# Patient Record
Sex: Female | Born: 1945 | Race: White | Hispanic: No | Marital: Married | Smoking: Former smoker
Health system: Southern US, Community
[De-identification: ages and names within clinical notes are randomized; demographics above are authoritative.]

## PROBLEM LIST (undated history)

## (undated) DIAGNOSIS — R51 Headache: Secondary | ICD-10-CM

## (undated) DIAGNOSIS — E785 Hyperlipidemia, unspecified: Secondary | ICD-10-CM

## (undated) DIAGNOSIS — F32A Depression, unspecified: Secondary | ICD-10-CM

## (undated) DIAGNOSIS — B3781 Candidal esophagitis: Secondary | ICD-10-CM

## (undated) DIAGNOSIS — F329 Major depressive disorder, single episode, unspecified: Secondary | ICD-10-CM

## (undated) DIAGNOSIS — R748 Abnormal levels of other serum enzymes: Secondary | ICD-10-CM

## (undated) DIAGNOSIS — I10 Essential (primary) hypertension: Secondary | ICD-10-CM

## (undated) HISTORY — DX: Headache: R51

## (undated) HISTORY — DX: Depression, unspecified: F32.A

## (undated) HISTORY — DX: Candidal esophagitis: B37.81

## (undated) HISTORY — DX: Abnormal levels of other serum enzymes: R74.8

## (undated) HISTORY — PX: CHOLECYSTECTOMY: SHX55

## (undated) HISTORY — PX: RECTOCELE REPAIR: SHX761

## (undated) HISTORY — DX: Essential (primary) hypertension: I10

## (undated) HISTORY — DX: Major depressive disorder, single episode, unspecified: F32.9

## (undated) HISTORY — DX: Hyperlipidemia, unspecified: E78.5

---

## 2008-12-05 ENCOUNTER — Ambulatory Visit: Payer: Self-pay | Admitting: Nurse Practitioner

## 2011-08-05 ENCOUNTER — Encounter: Payer: Self-pay | Admitting: Internal Medicine

## 2011-08-06 ENCOUNTER — Ambulatory Visit (INDEPENDENT_AMBULATORY_CARE_PROVIDER_SITE_OTHER)
Admission: RE | Admit: 2011-08-06 | Discharge: 2011-08-06 | Disposition: A | Discharge status: Home / Self Care | Payer: 59 | Source: Ambulatory Visit | Attending: Internal Medicine | Admitting: Internal Medicine

## 2011-08-06 ENCOUNTER — Ambulatory Visit (INDEPENDENT_AMBULATORY_CARE_PROVIDER_SITE_OTHER): Payer: 59 | Admitting: Internal Medicine

## 2011-08-06 ENCOUNTER — Encounter: Payer: Self-pay | Admitting: Internal Medicine

## 2011-08-06 VITALS — BP 148/88 | HR 93 | Temp 98.3°F | Ht 62.0 in | Wt 156.6 lb

## 2011-08-06 DIAGNOSIS — R059 Cough, unspecified: Secondary | ICD-10-CM

## 2011-08-06 DIAGNOSIS — R05 Cough: Secondary | ICD-10-CM | POA: Insufficient documentation

## 2011-08-06 DIAGNOSIS — J4530 Mild persistent asthma, uncomplicated: Secondary | ICD-10-CM | POA: Insufficient documentation

## 2011-08-06 DIAGNOSIS — J45909 Unspecified asthma, uncomplicated: Secondary | ICD-10-CM

## 2011-08-06 MED ORDER — TRAMADOL HCL 50 MG PO TABS
ORAL_TABLET | ORAL | Status: AC
Start: 2011-08-06 — End: 2011-08-16

## 2011-08-06 MED ORDER — PREDNISONE (PAK) 10 MG PO TABS: ORAL_TABLET | ORAL | Status: AC

## 2011-08-06 MED ORDER — OMEPRAZOLE 20 MG PO TBEC: DELAYED_RELEASE_TABLET | ORAL | Status: DC

## 2011-08-06 MED ORDER — FAMOTIDINE 20 MG PO TABS: ORAL_TABLET | ORAL | Status: DC

## 2011-08-06 NOTE — Patient Instructions (Addendum)
Stop advair, tessilon - only albuterol (ventolin) puffer when you can't catch your breath  Start Pulmicort twice daily with nebulized albuterol until cough completely gone for a couple of weeks then restart the advair   Please see patient coordinator before you leave today  to schedule sinus ct today if possible  The key to effective treatment for your cough is eliminating the non-stop cycle of cough you're stuck in long enough to let your airway heal completely and then see if there is anything still making you cough once you stop the cough suppression, but this should take no more than 5 days to figuure out  First take mucinex dm 2  every 12 hours and supplement if needed with  tramadol 50 mg up to 2 every 4 hours to suppress the urge to cough. Swallowing water or using ice chips/non mint and menthol containing candies (such as lifesavers or sugarless jolly ranchers) are also effective.  You should rest your voice and avoid activities that you know make you cough.  Once you have eliminated the cough for 3 straight days try reducing the tramadol first,  then the  mucinex dm as tolerated.    Try prilosec 20mg   Take 30-60 min before first meal of the day and Pepcid 20 mg one bedtime until cough is completely gone for at least a week without the need for cough suppression  I think of reflux for chronic cough like I do oxygen for fire (doesn't cause the fire but once you get the oxygen suppressed it usually goes away regardless of the exact cause).  GERD (REFLUX)  is an extremely common cause of respiratory symptoms, many times with no significant heartburn at all.    It can be treated with medication, but also with lifestyle changes including avoidance of late meals, excessive alcohol, smoking cessation, and avoid fatty foods, chocolate, peppermint, colas, red wine, and acidic juices such as orange juice.  NO MINT OR MENTHOL PRODUCTS SO NO COUGH DROPS  USE SUGARLESS CANDY INSTEAD (jolley ranchers  or Stover's)  NO OIL BASED VITAMINS - use powdered substitutes.   Prednisone 10 mg take  4 each am x 2 days,   2 each am x 2 days,  1 each am x2days and stop    Please remember to go to the x-ray department downstairs for your tests - we will call you with the results when they are available.     If you are satisfied with your treatment plan let your doctor know and he/she can either refill your medications or you can return here when your prescription runs out.     If in any way you are not 100% satisfied,  please tell us.  If 100% better, tell your friends!

## 2011-08-06 NOTE — Progress Notes (Signed)
  Subjective:    Patient ID: Caitlin Newman, female    DOB: 12/05/1945   MRN: [id number]  HPI  84 yowf no sign smoking hx  With lots of wheezing as child outgrew at very young age (doesn't remember) then recurrent wheezing coughing worse in spring since around 1980 prev seen by [facility] well controlled until Nov 2012 with refractory cough since then so referred 08/06/2011 to pulmonary clinic by PA Ninfa Linden from [place].  08/06/2011 1st pulmonary eval/ Erion Hermans cc persistent cough x 6 months acute onset with "flu" in November now present daily  - had been on advair maint and ACEI but the latter was stopped early May 2013 no improvement cough which is worse p eat and afternoons and cough for an hour at bedtime with sense of something stuck upper mid chest and coughs to point of choking/vomiting>  Productive of min yellow mucus.  Sinus symptoms have been about avg and doesn't use anything for them.  Prednisone did not help. Once gets to Sleep does ok without nocturnal  or early am exacerbation  of respiratory  c/o's or need for noct saba. Also denies any obvious fluctuation of symptoms with weather or environmental changes or other aggravating or alleviating factors except as outlined above    Review of Systems  Constitutional: Negative for fever and unexpected weight change.  HENT: Positive for congestion, sneezing, postnasal drip and sinus pressure. Negative for ear pain, nosebleeds, sore throat, rhinorrhea, trouble swallowing and dental problem.   Eyes: Negative for redness and itching.  Respiratory: Positive for cough, choking, shortness of breath and wheezing. Negative for chest tightness.   Cardiovascular: Negative for palpitations and leg swelling.  Gastrointestinal: Negative for nausea and vomiting.  Genitourinary: Negative for dysuria.  Musculoskeletal: Negative for joint swelling.  Skin: Negative for rash.  Neurological: Negative for headaches.  Hematological: Does not bruise/bleed  easily.  Psychiatric/Behavioral: Negative for dysphoric mood. The patient is nervous/anxious.        Objective:   Physical Exam amb wf nad Wt 156 08/06/2011 HEENT: nl dentition, turbinates, and orophanx. Nl external ear canals without cough reflex   NECK :  without JVD/Nodes/TM/ nl carotid upstrokes bilaterally   LUNGS: no acc muscle use, clear to A and P bilaterally without cough on insp or exp maneuvers   CV:  RRR  no s3 or murmur or increase in P2, no edema   ABD:  soft and nontender with nl excursion in the supine position. No bruits or organomegaly, bowel sounds nl  MS:  warm without deformities, calf tenderness, cyanosis or clubbing  SKIN: warm and dry without lesions    NEURO:  alert, approp, no deficits    CXR  08/06/2011 : Slight flattening of diaphragm on lateral image may reflect minimal hyperinflation. There is minimal central peribronchial thickening  Sinus CT 08/06/2011 Mild chronic sinusitis with mucosal edema in the maxillary sinus bilaterally.          Assessment & Plan:

## 2011-08-06 NOTE — Assessment & Plan Note (Signed)
The most common causes of chronic cough in immunocompetent adults include the following: upper airway cough syndrome (UACS), previously referred to as postnasal drip syndrome (PNDS), which is caused by variety of rhinosinus conditions; (2) asthma; (3) GERD; (4) chronic bronchitis from cigarette smoking or other inhaled environmental irritants; (5) nonasthmatic eosinophilic bronchitis; and (6) bronchiectasis.   These conditions, singly or in combination, have accounted for up to 94% of the causes of chronic cough in prospective studies.   Other conditions have constituted no >6% of the causes in prospective studies These have included bronchogenic carcinoma, chronic interstitial pneumonia, sarcoidosis, left ventricular failure, ACEI-induced cough, and aspiration from a condition associated with pharyngeal dysfunction.  .Chronic cough is often simultaneously caused by more than one condition. A single cause has been found from 38 to 82% of the time, multiple causes from 18 to 62%. Multiply caused cough has been the result of three diseases up to 42% of the time.     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   These are the same pts (now being labeled as having "irritable larynx syndrome" by some cough centers) who not infrequently have a history of having failed to tolerate ace inhibitors,  dry powder inhalers or biphosphonates or report having atypical reflux symptoms that don't respond to standard doses of PPI , and are easily confused as having aecopd or  asthma flares by even experienced allergists/ pulmonologists.  She was on ACEI which are still probably perpetuating the cough at this point (4 weeks minimum is rec and would avoid these in the future as well), and coughs so hard she vomits so much be refluxing  The standardized cough guidelines recently published in Chest by [place] in 2006  are a multiple step process (up to 12!) , not a single office visit,  and are intended  to address this problem logically,  with an alogrithm dependent on response to empiric treatment at  each progressive step  to determine a specific diagnosis with  minimal addtional testing needed. Therefore if compliance is an issue or can't be accurately verified then it's very unlikely the standard evaluation and treatment will be successful here.    Furthermore, response to therapy (other than acute cough suppression, which should only be used short term with avoidance of narcotic containing cough syrups if possible), can be a gradual process for which the patient may not receive immediate benefit.  Unlike going to an eye doctor where the right rx is almost always the first one and is immediately effective, this is almost never the case in the management of chronic cough syndromes and the patient needs to commit up front to compliance with recommendations and have the patience to wait out a response for up to 6 weeks of therapy directed at the likely underlying problem(s).   For now rec focus on eliminating the cycle of cough> airway trauma>inflammation>irritability> cough  See instructions for specific recommendations which were reviewed directly with the patient who was given a copy with highlighter outlining the key  components.

## 2011-08-06 NOTE — Assessment & Plan Note (Signed)
Since DPI's and especially advair can perpetuate the cough cycle rec she change over to neb pulmicort until better then ok to rechallenge with ace  Note the ddx for DDX of  difficult airways managment all start with A and  include Adherence, Ace Inhibitors, Acid Reflux, Active Sinus Disease, Alpha 1 Antitripsin deficiency, Anxiety masquerading as Airways dz,  ABPA,  allergy(esp in young), Aspiration (esp in elderly), Adverse effects of DPI,  Active smokers, plus two Bs  = Bronchiectasis and Beta blocker use..and one C= CHF   So she has 3 of the "usual suspects" which may be contributing to various extent to her cough and refractory symptoms but there's plenty more to do if not 100% satisfied in terms of tracking down an exact cause and fixing it.

## 2011-08-12 NOTE — Progress Notes (Signed)
Quick Note:  Spoke with pt and notified of results per Dr. Wert. Pt verbalized understanding and denied any questions.  ______ 

## 2011-10-12 ENCOUNTER — Telehealth: Payer: Self-pay | Admitting: Internal Medicine

## 2011-10-12 NOTE — Telephone Encounter (Signed)
Spoke with the pt and she was set to see MW on 10-13-11 but had to cancel appt and is requesting another appt this week. I advised the pt that there are no appts available this week but offered Monday at 9am. Pt accepts this appt. Carron Curie, CMA

## 2011-10-13 ENCOUNTER — Ambulatory Visit: Payer: 59 | Admitting: Internal Medicine

## 2011-10-18 ENCOUNTER — Encounter: Payer: Self-pay | Admitting: Internal Medicine

## 2011-10-18 ENCOUNTER — Ambulatory Visit (INDEPENDENT_AMBULATORY_CARE_PROVIDER_SITE_OTHER): Payer: 59 | Admitting: Internal Medicine

## 2011-10-18 VITALS — BP 134/68 | HR 84 | Temp 98.0°F | Ht 62.0 in | Wt 151.0 lb

## 2011-10-18 DIAGNOSIS — J45909 Unspecified asthma, uncomplicated: Secondary | ICD-10-CM

## 2011-10-18 DIAGNOSIS — R05 Cough: Secondary | ICD-10-CM

## 2011-10-18 MED ORDER — PREDNISONE (PAK) 10 MG PO TABS: ORAL_TABLET | ORAL | Status: AC

## 2011-10-18 MED ORDER — TRAMADOL HCL 50 MG PO TABS: ORAL_TABLET | ORAL | Status: AC

## 2011-10-18 MED ORDER — BUDESONIDE 1 MG/2ML IN SUSP: 1.0000 mg | Freq: Two times a day (BID) | RESPIRATORY_TRACT | Status: DC

## 2011-10-18 MED ORDER — FORMOTEROL FUMARATE 20 MCG/2ML IN NEBU: 20.0000 ug | INHALATION_SOLUTION | Freq: Two times a day (BID) | RESPIRATORY_TRACT | Status: DC

## 2011-10-18 NOTE — Assessment & Plan Note (Signed)
Not clear how much of this is asthma but should do fine with budesonide/ performist for now then return in 4 weeks to regroup

## 2011-10-18 NOTE — Patient Instructions (Addendum)
Stop advair, tessilon - only albuterol (ventolin) puffer when you can't catch your breath  Start Pulmicort twice daily with nebulized performist until return    The key to effective treatment for your cough is eliminating the non-stop cycle of cough you're stuck in long enough to let your airway heal completely and then see if there is anything still making you cough once you stop the cough suppression, but this should take no more than 5 days to figuure out  First take delsym 2 tsp every 12 hours and supplement if needed with  tramadol 50 mg up to 2 every 4 hours to suppress the urge to cough. Swallowing water or using ice chips/non mint and menthol containing candies (such as lifesavers or sugarless jolly ranchers) are also effective.  You should rest your voice and avoid activities that you know make you cough.  Once you have eliminated the cough for 3 straight days try reducing the tramadol first,  then the  mucinex dm as tolerated.    Try prilosec 20mg   Take 30-60 min before first meal of the day and Pepcid 20 mg one bedtime until return  GERD (REFLUX)  is an extremely common cause of respiratory symptoms, many times with no significant heartburn at all.    It can be treated with medication, but also with lifestyle changes including avoidance of late meals, excessive alcohol, smoking cessation, and avoid fatty foods, chocolate, peppermint, colas, red wine, and acidic juices such as orange juice.  NO MINT OR MENTHOL PRODUCTS SO NO COUGH DROPS  USE SUGARLESS CANDY INSTEAD (jolley ranchers or Stover's)  NO OIL BASED VITAMINS - use powdered substitutes.   Prednisone 10 mg take  4 each am x 2 days,   2 each am x 2 days,  1 each am x2days and stop    Please schedule a follow up office visit in 2 weeks, sooner if needed

## 2011-10-18 NOTE — Assessment & Plan Note (Signed)
Most likely this is  Classic Upper airway cough syndrome, so named because it's frequently impossible to sort out how much is  CR/sinusitis with freq throat clearing (which can be related to primary GERD)   vs  causing  secondary (" extra esophageal")  GERD from wide swings in gastric pressure that occur with throat clearing, often  promoting self use of mint and menthol lozenges that reduce the lower esophageal sphincter tone and exacerbate the problem further in a cyclical fashion.   These are the same pts (now being labeled as having "irritable larynx syndrome" by some cough centers) who not infrequently have a history of having failed to tolerate ace inhibitors,  dry powder inhalers or biphosphonates or report having atypical reflux symptoms that don't respond to standard doses of PPI , and are easily confused as having aecopd or asthma flares by even experienced allergists/ pulmonologists.  To sort out what's cough variant asthma triggered by dry powder inhalers and what's not, rec change to pulmicort and bud until cough has resolved then rechallenge with hfa at that point.  In meantime stop adair and continued max gerd rx

## 2011-10-18 NOTE — Progress Notes (Signed)
Subjective:    Patient ID: Caitlin Newman, female    DOB: Jul 30, 1945   MRN: [id number]  HPI  69 yowf no sign smoking hx  With lots of wheezing as child outgrew at very young age (doesn't remember) then recurrent wheezing coughing worse in spring since around 1980 prev seen by [facility] well controlled until Nov 2012 with refractory cough since then so referred 08/06/2011 to pulmonary clinic by PA Ninfa Linden from [place].  08/06/2011 1st pulmonary eval/ Junior Kenedy cc persistent cough x 6 months acute onset with "flu" in November now present daily  - had been on advair maint and ACEI but the latter was stopped early May 2013 no improvement cough which is worse p eat and afternoons and cough for an hour at bedtime with sense of something stuck upper mid chest and coughs to point of choking/vomiting>  Productive of min yellow mucus. rec Stop advair, tessilon - only albuterol (ventolin) puffer when you can't catch your breath Start Pulmicort twice daily with nebulized albuterol until cough completely gone for a couple of weeks then restart the advair  Schedule sinus ct > ok  Try prilosec 20mg   Take 30-60 min before first meal of the day and Pepcid 20 mg one bedtime until cough is completely gone for at least a week without the need for cough suppression GERD diet Prednisone 10 mg take  4 each am x 2 days,   2 each am x 2 days,  1 each am x2days and stop     10/18/2011 f/u ov/Tanayah Squitieri cc got some better but never completely then resumed advair and gradually worsening  Cough again  to the point of choking and vomiting despite rx with  ppi and h2hs- no sob unless coughing  Sinus symptoms have been about avg and doesn't use anything for them.  Prednisone did not help previously.  Once gets to Sleep does ok without nocturnal  or early am exacerbation  of respiratory  c/o's or need for noct saba. Also denies any obvious fluctuation of symptoms with weather or environmental changes or other aggravating or alleviating  factors except as outlined above   ROS  The following are not active complaints unless bolded sore throat, dysphagia, dental problems, itching, sneezing,  nasal congestion or excess/ purulent secretions, ear ache,   fever, chills, sweats, unintended wt loss, pleuritic or exertional cp, hemoptysis,  orthopnea pnd or leg swelling, presyncope, palpitations, heartburn, abdominal pain, anorexia, nausea, vomiting, diarrhea  or change in bowel or urinary habits, change in stools or urine, dysuria,hematuria,  rash, arthralgias, visual complaints, headache, numbness weakness or ataxia or problems with walking or coordination,  change in mood/affect or memory.        Objective:   Physical Exam  amb wf very hoarse and classic upper airway cough pattern  Wt 156 08/06/2011 > 10/18/2011  151  HEENT: nl dentition, turbinates, and orophanx. Nl external ear canals without cough reflex   NECK :  without JVD/Nodes/TM/ nl carotid upstrokes bilaterally   LUNGS: no acc muscle use, mostly pseudowheeze  CV:  RRR  no s3 or murmur or increase in P2, no edema   ABD:  soft and nontender with nl excursion in the supine position. No bruits or organomegaly, bowel sounds nl  MS:  warm without deformities, calf tenderness, cyanosis or clubbing  SKIN: warm and dry without lesions    NEURO:  alert, approp, no deficits    CXR  08/06/2011 : Slight flattening of diaphragm on lateral image may  reflect minimal hyperinflation. There is minimal central peribronchial thickening  Sinus CT 08/06/2011 Mild chronic sinusitis with mucosal edema in the maxillary sinus bilaterally.     Assessment & Plan:

## 2011-10-22 ENCOUNTER — Telehealth: Payer: Self-pay | Admitting: Internal Medicine

## 2011-10-22 NOTE — Telephone Encounter (Signed)
Called work number- unable to reach [employer] number and NA, no option to leave a msg, WCB.

## 2011-10-25 NOTE — Telephone Encounter (Signed)
Pt calling again in ref to previous msg can be reached at [phone].Caitlin Newman

## 2011-10-25 NOTE — Telephone Encounter (Addendum)
Pt called back again. Please call before 3:30 today. Pt needs meds asap and doesn't understand why this is taking so long. If calling afterwards call cell # [phone]. Caitlin Newman

## 2011-10-26 NOTE — Telephone Encounter (Signed)
Called [facility] at [phone], spoke with [place].  Pulmicort 1mg / 2mL neb med bid has been APPROVED October 05, 2011 - Oct 25, 2012.  Case ID # [id number].    Called [facility], spoke with Vernona Rieger. She is aware of approval.    Called, spoke with pt.  I apologized in the delay of this matter and informed pulmicort has been approved.  She verbalized understanding and voiced no further questions/concerns at this time.

## 2011-11-08 ENCOUNTER — Encounter: Payer: Self-pay | Admitting: Internal Medicine

## 2011-11-08 ENCOUNTER — Ambulatory Visit (INDEPENDENT_AMBULATORY_CARE_PROVIDER_SITE_OTHER): Payer: 59 | Admitting: Internal Medicine

## 2011-11-08 VITALS — BP 148/80 | HR 72 | Temp 98.3°F | Ht 62.0 in | Wt 156.6 lb

## 2011-11-08 DIAGNOSIS — J45909 Unspecified asthma, uncomplicated: Secondary | ICD-10-CM

## 2011-11-08 MED ORDER — AMOXICILLIN-POT CLAVULANATE 875-125 MG PO TABS: 1.0000 | ORAL_TABLET | Freq: Two times a day (BID) | ORAL | Status: AC

## 2011-11-08 MED ORDER — PREDNISONE (PAK) 10 MG PO TABS: ORAL_TABLET | ORAL | Status: AC

## 2011-11-08 NOTE — Progress Notes (Signed)
Subjective:    Patient ID: Caitlin Newman, female    DOB: November 20, 1945   MRN: [id number]  HPI  34 yowf no sign smoking hx  With lots of wheezing as child outgrew at very young age (doesn't remember) then recurrent wheezing coughing worse in spring since around 1980 prev seen by [facility] well controlled until Nov 2012 with refractory cough since then so referred 08/06/2011 to pulmonary clinic by PA Ninfa Linden from [place].  08/06/2011 1st pulmonary eval/ Wert cc persistent cough x 6 months acute onset with "flu" in November now present daily  - had been on advair maint and ACEI but the latter was stopped early May 2013 no improvement cough which is worse p eat and afternoons and cough for an hour at bedtime with sense of something stuck upper mid chest and coughs to point of choking/vomiting>  Productive of min yellow mucus. rec Stop advair, tessilon - only albuterol (ventolin) puffer when you can't catch your breath Start Pulmicort twice daily with nebulized albuterol until cough completely gone for a couple of weeks then restart the advair  Schedule sinus ct > Mild chronic sinusitis with mucosal edema in the maxillary sinus  bilaterally. Try prilosec 20mg   Take 30-60 min before first meal of the day and Pepcid 20 mg one bedtime until cough is completely gone for at least a week without the need for cough suppression GERD diet Prednisone 10 mg take  4 each am x 2 days,   2 each am x 2 days,  1 each am x2days and stop     10/18/2011 f/u ov/Wert cc got some better but never completely then resumed advair and gradually worsening  Cough again  to the point of choking and vomiting despite rx with  ppi and h2hs- no sob unless coughing Sinus symptoms have been about avg and doesn't use anything for them.  Prednisone did not help previously. rec Stop advair, tessilon - only albuterol (ventolin) puffer when you can't catch your breath Start Pulmicort twice daily with nebulized performist until  return Cyclical cough rx  11/08/2011 f/u ov/Wert cc 50% better overall on bud/perfomist x 2 weeks, not needing any other breathing treatments.  Immediately on lying down  feels need to cough mucus up quite thick slt green, min vol, on delsym. By 10 am cough has resumed, once coughs mucus up good to go for the rest of the day but takes up to an hour   Once gets to Sleep does ok without nocturnal  or early am exacerbation  of respiratory  c/o's or need for noct saba. Also denies any obvious fluctuation of symptoms with weather or environmental changes or other aggravating or alleviating factors except as outlined above   ROS  The following are not active complaints unless bolded sore throat, dysphagia, dental problems, itching, sneezing,  nasal congestion or excess/ purulent secretions, ear ache,   fever, chills, sweats, unintended wt loss, pleuritic or exertional cp, hemoptysis,  orthopnea pnd or leg swelling, presyncope, palpitations, heartburn, abdominal pain, anorexia, nausea, vomiting, diarrhea  or change in bowel or urinary habits, change in stools or urine, dysuria,hematuria,  rash, arthralgias, visual complaints, headache, numbness weakness or ataxia or problems with walking or coordination,  change in mood/affect or memory.        Objective:   Physical Exam  amb wf moderately  hoarse and classic upper airway cough pattern  Wt 156 08/06/2011 > 10/18/2011  151 >11/08/2011  156  HEENT: nl dentition, turbinates, and orophanx.  Nl external ear canals without cough reflex   NECK :  without JVD/Nodes/TM/ nl carotid upstrokes bilaterally   LUNGS: no acc muscle use, mostly pseudowheeze bilaterally  CV:  RRR  no s3 or murmur or increase in P2, no edema   ABD:  soft and nontender with nl excursion in the supine position. No bruits or organomegaly, bowel sounds nl  MS:  warm without deformities, calf tenderness, cyanosis or clubbing      CXR  08/06/2011 : Slight flattening of diaphragm on  lateral image may reflect minimal hyperinflation. There is minimal central peribronchial thickening  Sinus CT 08/06/2011 Mild chronic sinusitis with mucosal edema in the maxillary sinus bilaterally.     Assessment & Plan:

## 2011-11-08 NOTE — Assessment & Plan Note (Signed)
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

## 2011-11-08 NOTE — Patient Instructions (Addendum)
Mucinex 600 mg 2 every 12 hours for congestion / rattling  Augmentin twice daily with bafast and supper and yogurt for lunch   Prednisone 10 mg take  4 each am x 2 days,   2 each am x 2 days,  1 each am x2days and stop   Use plenty of nasal saline   Please schedule a follow up office visit in 3 weeks, sooner if needed   Late add: consider chest ct, alpha one and allergy studies next ov

## 2011-11-11 ENCOUNTER — Telehealth: Payer: Self-pay | Admitting: Internal Medicine

## 2011-11-11 NOTE — Telephone Encounter (Signed)
Best option is dulera 100 2bid when runs out and recheck on return

## 2011-11-11 NOTE — Telephone Encounter (Signed)
PT reports that she is feeling better since on abx and prednisone.  Pt will run out of Performist and Pulmicort on 11-25-11 and doesn't want to have these refilled due to cost.  PT doesn't see MW again until 12-14-11.  Please advise if pt can change  med to replace neb meds.

## 2011-11-11 NOTE — Telephone Encounter (Signed)
Spoke with pt and notified of recs per MW. Samples of Dulera 100 up front for pick up.

## 2011-11-22 ENCOUNTER — Telehealth: Payer: Self-pay | Admitting: Internal Medicine

## 2011-11-22 ENCOUNTER — Encounter: Payer: Self-pay | Admitting: *Deleted

## 2011-11-22 NOTE — Telephone Encounter (Signed)
Pt states she finished the Augmentin 2 days ago but still has cough and congestion. She says her symptoms are better but not gone. I have instructed the pt to continue on the Mucinex twice a day and to give it a few more days because the abx is still working in her system. She verbalized understanding and and will give it a few more days and call if her symptoms get worse.

## 2011-12-07 ENCOUNTER — Encounter: Payer: Self-pay | Admitting: Internal Medicine

## 2011-12-07 ENCOUNTER — Ambulatory Visit (INDEPENDENT_AMBULATORY_CARE_PROVIDER_SITE_OTHER): Payer: 59 | Admitting: Internal Medicine

## 2011-12-07 ENCOUNTER — Other Ambulatory Visit (INDEPENDENT_AMBULATORY_CARE_PROVIDER_SITE_OTHER): Payer: 59

## 2011-12-07 ENCOUNTER — Telehealth: Payer: Self-pay | Admitting: Internal Medicine

## 2011-12-07 ENCOUNTER — Ambulatory Visit: Payer: 59 | Admitting: Adult Health

## 2011-12-07 VITALS — BP 152/82 | HR 73 | Temp 97.5°F | Ht 62.0 in | Wt 156.8 lb

## 2011-12-07 DIAGNOSIS — J45909 Unspecified asthma, uncomplicated: Secondary | ICD-10-CM

## 2011-12-07 DIAGNOSIS — R05 Cough: Secondary | ICD-10-CM

## 2011-12-07 LAB — CBC WITH DIFFERENTIAL/PLATELET
Basophils Absolute: 0 10*3/uL (ref 0.0–0.1)
Eosinophils Relative: 3.5 % (ref 0.0–5.0)
Lymphocytes Relative: 41.2 % (ref 12.0–46.0)
Lymphs Abs: 3 10*3/uL (ref 0.7–4.0)
Monocytes Relative: 7.4 % (ref 3.0–12.0)
Neutrophils Relative %: 47.2 % (ref 43.0–77.0)
Platelets: 291 10*3/uL (ref 150.0–400.0)
RDW: 13.6 % (ref 11.5–14.6)
WBC: 7.3 10*3/uL (ref 4.5–10.5)

## 2011-12-07 MED ORDER — AMOXICILLIN-POT CLAVULANATE 875-125 MG PO TABS: 1.0000 | ORAL_TABLET | Freq: Two times a day (BID) | ORAL | Status: DC

## 2011-12-07 MED ORDER — TRAMADOL HCL 50 MG PO TABS: ORAL_TABLET | ORAL | Status: DC

## 2011-12-07 MED ORDER — PREDNISONE (PAK) 10 MG PO TABS: ORAL_TABLET | ORAL | Status: DC

## 2011-12-07 NOTE — Telephone Encounter (Signed)
Was due back by now for f/u - needs ov with all meds in hand to see Tammy Np and consider ct chest (for bronchiectasis) and repeat sinus ct   Nothing further to offer over the phone Can add on to see me this pm also but not 9/25

## 2011-12-07 NOTE — Patient Instructions (Addendum)
Take mucinex 2  every 12 hours and supplement if needed with  tramadol 50 mg up to 1 every 4 hours to suppress the urge to cough. Swallowing water or using ice chips/non mint and menthol containing candies (such as lifesavers or sugarless jolly ranchers) are also effective.  You should rest your voice and avoid activities that you know make you cough.  Once you have eliminated the cough for 3 straight days try reducing the tramadol first,  then the delsym as tolerated.    Augmentin twice daily with bafast and supper and yogurt for lunch  X 10 days  Prednisone 10 mg take  4 each am x 2 days,   2 each am x 2 days,  1 each am x2days and stop   Use plenty of nasal saline   Work on inhaler technique:  relax and gently blow all the way out then take a nice smooth deep breath back in, triggering the inhaler at same time you start breathing in.  Hold for up to 5 seconds if you can.  Rinse and gargle with water when done   If your mouth or throat starts to bother you,   I suggest you time the inhaler to your dental care and after using the inhaler(s) brush teeth and tongue with a baking soda containing toothpaste and when you rinse this out, gargle with it first to see if this helps your mouth and throat.     Please remember to go to the lab  department downstairs for your tests - we will call you with the results when they are available.  Please schedule a follow up office visit in 4 weeks, sooner if needed (ok to come to the [place] the 3rd week in October)

## 2011-12-07 NOTE — Progress Notes (Signed)
Subjective:    Patient ID: Caitlin Newman, female    DOB: 1945-08-27   MRN: [id number]  HPI  89 yowf no sign smoking hx  With lots of wheezing as child outgrew at very young age (doesn't remember) then recurrent wheezing coughing worse in spring since around 1980 prev seen by [facility] well controlled until Nov 2012 with refractory cough since then so referred 08/06/2011 to pulmonary clinic by PA Ninfa Linden from [place].  08/06/2011 1st pulmonary eval/ Caitlin Newman cc persistent cough x 6 months acute onset with "flu" in November now present daily  - had been on advair maint and ACEI but the latter was stopped early May 2013 no improvement cough which is worse p eat and afternoons and cough for an hour at bedtime with sense of something stuck upper mid chest and coughs to point of choking/vomiting>  Productive of min yellow mucus. rec Stop advair, tessilon - only albuterol (ventolin) puffer when you can't catch your breath Start Pulmicort twice daily with nebulized albuterol until cough completely gone for a couple of weeks then restart the advair  Schedule sinus ct > Mild chronic sinusitis with mucosal edema in the maxillary sinus  bilaterally. Try prilosec 20mg   Take 30-60 min before first meal of the day and Pepcid 20 mg one bedtime until cough is completely gone for at least a week without the need for cough suppression GERD diet Prednisone 10 mg take  4 each am x 2 days,   2 each am x 2 days,  1 each am x2days and stop     10/18/2011 f/u ov/Caitlin Newman cc got some better but never completely then resumed advair and gradually worsening  Cough again  to the point of choking and vomiting despite rx with  ppi and h2hs- no sob unless coughing Sinus symptoms have been about avg and doesn't use anything for them.  Prednisone did not help previously. rec Stop advair, tessilon - only albuterol (ventolin) puffer when you can't catch your breath Start Pulmicort twice daily with nebulized performist until  return Cyclical cough rx  11/08/2011 f/u ov/Caitlin Newman cc 50% better overall on bud/perfomist x 2 weeks, not needing any other breathing treatments.  Immediately on lying down  feels need to cough mucus up quite thick slt green, min vol, on delsym. By 10 am cough has resumed, once coughs mucus up good to go for the rest of the day but takes up to an hour rec Mucinex 600 mg 2 every 12 hours for congestion / rattling Augmentin twice daily with bafast and supper and yogurt for lunch  Prednisone 10 mg take  4 each am x 2 days,   2 each am x 2 days,  1 each am x2days and stop  Use plenty of nasal saline  Please schedule a follow up office visit in 3 weeks, sooner if needed   Late add: consider chest ct, alpha one and allergy studies next ov    12/07/2011 f/u ov/Caitlin Newman cc pt states prod cough with brown mucus "chunks" has worsened x 2 weeks p changed performist/bud to dulera (poor mdi noted, see below). pt also c/o wheezing.  Staying on prilosec and pepcid thruout.  Coughing so hard loosing her breath.  No obvious daytime variabilty or  p or chest tightness, subjective wheeze overt sinus or hb symptoms. No unusual exp hx    Once gets to Sleep does ok without nocturnal  or early am exacerbation  of respiratory  c/o's or need for noct saba. Also  denies any obvious fluctuation of symptoms with weather or environmental changes or other aggravating or alleviating factors except as outlined above   ROS  The following are not active complaints unless bolded sore throat, dysphagia, dental problems, itching, sneezing,  nasal congestion or excess/ purulent secretions, ear ache,   fever, chills, sweats, unintended wt loss, pleuritic or exertional cp, hemoptysis,  orthopnea pnd or leg swelling, presyncope, palpitations, heartburn, abdominal pain, anorexia, nausea, vomiting, diarrhea  or change in bowel or urinary habits, change in stools or urine, dysuria,hematuria,  rash, arthralgias, visual complaints, headache, numbness  weakness or ataxia or problems with walking or coordination,  change in mood/affect or memory.        Objective:   Physical Exam  amb wf moderately  hoarse and harsh upper airway cough pattern  Wt 156 08/06/2011 > 10/18/2011  151 >11/08/2011  156 > 156 12/07/2011   HEENT: nl dentition, turbinates, and orophanx. Nl external ear canals without cough reflex   NECK :  without JVD/Nodes/TM/ nl carotid upstrokes bilaterally   LUNGS: no acc muscle use, mostly pseudowheeze bilaterally  CV:  RRR  no s3 or murmur or increase in P2, no edema   ABD:  soft and nontender with nl excursion in the supine position. No bruits or organomegaly, bowel sounds nl  MS:  warm without deformities, calf tenderness, cyanosis or clubbing      CXR  08/06/2011 : Slight flattening of diaphragm on lateral image may reflect minimal hyperinflation. There is minimal central peribronchial thickening  Sinus CT 08/06/2011 Mild chronic sinusitis with mucosal edema in the maxillary sinus bilaterally.     Assessment & Plan:

## 2011-12-07 NOTE — Telephone Encounter (Signed)
See previous note

## 2011-12-07 NOTE — Telephone Encounter (Signed)
Called and spoke with patient, patient states she is still coughing and is currently coughing up brown pieces of "junk".  Patient states she called in last week req another round of antibiotic sent in but was told to let the previous antibiotic try and keep working.  Patient is taking mucinex for symtpoms.  Last antibiotic called in was Augmentin BID.  Dr. Sherene Sires please advise, thank you!  Last OV :11/08/11 Next OV:12/15/11  Using [facility] for pharmacy  Allergies  Allergen Reactions  . Avelox (Moxifloxacin Hcl In Nacl)   . Sulfa Antibiotics

## 2011-12-07 NOTE — Assessment & Plan Note (Signed)
-   D/c advair 10/18/2011 due to cough   - HFA 12/07/2011 50% p coaching  DDX of  difficult airways managment all start with A and  include Adherence, Ace Inhibitors, Acid Reflux, Active Sinus Disease, Alpha 1 Antitripsin deficiency, Anxiety masquerading as Airways dz,  ABPA,  allergy(esp in young), Aspiration (esp in elderly), Adverse effects of DPI,  Active smokers, plus two Bs  = Bronchiectasis and Beta blocker use..and one C= CHF  ? Adh> Adherence is always the initial "prime suspect" and is a multilayered concern that requires a "trust but verify" approach in every patient - starting with knowing how to use medications, especially inhalers, correctly, keeping up with refills and understanding the fundamental difference between maintenance and prns vs those medications only taken for a very short course and then stopped and not refilled. The proper method of use, as well as anticipated side effects, of a metered-dose inhaler are discussed and demonstrated to the patient. Improved effectiveness after extensive coaching during this visit to a level of approximately  50% so needs more work  ? Acid reflux > reviewed rx  ? Allergies > send profile 12/07/2011

## 2011-12-07 NOTE — Telephone Encounter (Signed)
I spoke with the pt and advised of recs. Pt states she is 1 hour away and cannot be her until 4pm. Appt set, MW is aware. Carron Curie, CMA

## 2011-12-07 NOTE — Assessment & Plan Note (Signed)
Much worse and out of proportion to asthma  Chronic cough is often simultaneously caused by more than one condition. A single cause has been found from 38 to 82% of the time, multiple causes from 18 to 62%. Multiply caused cough has been the result of three diseases up to 42% of the time.   For now will use the mildest asthma rx feasible in form of dulera 100 with low threshold to step down to qvar if improves  Of the three most common causes of chronic cough, only one (GERD)  can actually cause the other two (asthma and post nasal drip syndrome)  and perpetuate the cylce of cough inducing airway trauma, inflammation, heightened sensitivity to reflux which is prompted by the cough itself via a cyclical mechanism.    This may partially respond to steroids and look like asthma and post nasal drainage but never erradicated completely unless the cough and the secondary reflux are eliminated, preferably both at the same time.  While not intuitively obvious, many patients with chronic low grade reflux do not cough until there is a secondary insult that disturbs the protective epithelial barrier and exposes sensitive nerve endings.  This can be viral or direct physical injury such as with an endotracheal tube.   The point is that once this occurs, it is difficult to eliminate using anything but a maximally effective acid suppression regimen at least in the short run, accompanied by an appropriate diet to address non acid GERD.   See instructions for specific recommendations which were reviewed directly with the patient who was given a copy with highlighter outlining the key components.

## 2011-12-08 LAB — ALLERGY PROFILE REGION II-DC, DE, MD, ~~LOC~~, VA
Alternaria Alternata: <0.1 kU/L
Cat Dander: <0.1 kU/L
Cockroach: <0.1 kU/L
Common Ragweed: <0.1 kU/L
Dog Dander: <0.1 kU/L
Elm IgE: <0.1 kU/L
IgE (Immunoglobulin E), Serum: 89.4 IU/mL (ref 0.0–180.0)
Johnson Grass: <0.1 kU/L
Lamb's Quarters: <0.1 kU/L
Meadow Grass: <0.1 kU/L
Pecan/Hickory Tree IgE: <0.1 kU/L

## 2011-12-10 ENCOUNTER — Encounter: Payer: Self-pay | Admitting: Internal Medicine

## 2011-12-10 NOTE — Progress Notes (Signed)
Quick Note:  Spoke with pt and notified of results per Dr. Wert. Pt verbalized understanding and denied any questions.  ______ 

## 2011-12-14 ENCOUNTER — Ambulatory Visit: Payer: 59 | Admitting: Internal Medicine

## 2011-12-27 ENCOUNTER — Ambulatory Visit (INDEPENDENT_AMBULATORY_CARE_PROVIDER_SITE_OTHER): Payer: 59 | Admitting: Internal Medicine

## 2011-12-27 ENCOUNTER — Encounter: Payer: Self-pay | Admitting: Internal Medicine

## 2011-12-27 VITALS — BP 112/60 | HR 82 | Temp 98.0°F | Ht 62.0 in | Wt 155.0 lb

## 2011-12-27 DIAGNOSIS — J479 Bronchiectasis, uncomplicated: Secondary | ICD-10-CM

## 2011-12-27 DIAGNOSIS — R05 Cough: Secondary | ICD-10-CM

## 2011-12-27 DIAGNOSIS — J45909 Unspecified asthma, uncomplicated: Secondary | ICD-10-CM

## 2011-12-27 MED ORDER — MONTELUKAST SODIUM 10 MG PO TABS: 10.0000 mg | ORAL_TABLET | Freq: Every day | ORAL | Status: DC

## 2011-12-27 NOTE — Patient Instructions (Addendum)
Dulera 100 Take 2 puffs first thing in am and then another 2 puffs about 12 hours later but make sure you look to be sure the count goes down by 2 each treatment.  Prilosec omeprazole 20 mg Take 30- 60 min before your first and last meals of the day and pepcid 20 mg one at bedtime    Singulair 10 mg one each pm  As needed for drainage take chortrimeton 4 mg every 6 hours.  Please schedule a follow up office visit in 2  weeks, sooner if needed CT chest  And flutter valve training on return to  See Tammy NP w/in 2 weeks with all your medications, even over the counter meds, separated in two separate bags, the ones you take no matter what vs the ones you stop once you feel better and take only as needed when you feel you need them.   Tammy  will generate for you a new user friendly medication calendar that will put [place] all on the same page re: your medication use.     Without this process, it simply isn't possible to assure that we are providing  your outpatient care  with  the attention to detail we feel you deserve.   If we cannot assure that you're getting that kind of care,  then we cannot manage your problem effectively from this clinic.  Once you have seen Tammy and we are sure that we're all on the same page with your medication use she will arrange follow up with me.   Late add Needs alpha one AT genotype also flutter valve training

## 2011-12-27 NOTE — Progress Notes (Signed)
Subjective:    Patient ID: Caitlin Newman, female    DOB: Sep 26, 1945   MRN: [id number]  HPI  48 yowf no sign smoking hx  With lots of wheezing as child outgrew at very young age (doesn't remember) then recurrent wheezing coughing worse in spring since around 1980 prev seen by [facility] well controlled until Nov 2012 with refractory cough since then so referred 08/06/2011 to pulmonary clinic by PA Ninfa Linden from [place].  08/06/2011 1st pulmonary eval/ Caitlin Newman cc persistent cough x 6 months acute onset with "flu" in November now present daily  - had been on advair maint and ACEI but the latter was stopped early May 2013 no improvement cough which is worse p eat and afternoons and cough for an hour at bedtime with sense of something stuck upper mid chest and coughs to point of choking/vomiting>  Productive of min yellow mucus. rec Stop advair, tessilon - only use albuterol (ventolin) puffer as rescue Start Pulmicort twice daily with nebulized albuterol until cough completely gone for a couple of weeks then restart the advair  Schedule sinus ct > Mild chronic sinusitis with mucosal edema in the maxillary sinus  bilaterally. Try prilosec 20mg   Take 30-60 min before first meal of the day and Pepcid 20 mg one bedtime until cough is completely gone for at least a week without the need for cough suppression GERD diet Prednisone 10 mg take  4 each am x 2 days,   2 each am x 2 days,  1 each am x2days and stop     10/18/2011 f/u ov/Caitlin Newman cc got some better but never completely then resumed advair and gradually worsening  Cough again  to the point of choking and vomiting despite rx with  ppi and h2hs- no sob unless coughing Sinus symptoms have been about avg and doesn't use anything for them.  Prednisone did not help previously. rec Stop advair, tessilon - only use albuterol (ventolin) puffer as rescue Start Pulmicort twice daily with nebulized performist until return Cyclical cough rx  11/08/2011 f/u  ov/Caitlin Newman cc 50% better overall on bud/perfomist x 2 weeks, not needing any other breathing treatments.  Immediately on lying down  feels need to cough mucus up quite thick slt green, min vol, on delsym. By 10 am cough has resumed, once coughs mucus up good to go for the rest of the day but takes up to an hour rec Mucinex 600 mg 2 every 12 hours for congestion / rattling Augmentin twice daily with bafast and supper and yogurt for lunch  Prednisone 10 mg take  4 each am x 2 days,   2 each am x 2 days,  1 each am x2days and stop  Use plenty of nasal saline       12/07/2011 f/u ov/Caitlin Newman cc pt states prod cough with brown mucus "chunks" has worsened x 2 weeks p changed performist/bud to dulera (poor mdi noted, see below). pt also c/o wheezing.  Staying on prilosec and pepcid thruout.  Coughing so hard loosing her breath.   rec Take mucinex 2  every 12 hours and supplement if needed with  tramadol 50 mg up to 1 every 4 hours to suppress the urge to cough. Once you have eliminated the cough for 3 straight days try reducing the tramadol first,  then the delsym as tolerated.   Augmentin twice daily with bafast and supper and yogurt for lunch  X 10 days Prednisone 10 mg take  4 each am x 2 days,  2 each am x 2 days,  1 each am x2days and stop  Use plenty of nasal saline  Work on inhaler technique:        12/27/2011 f/u ov/Caitlin Newman cc only went two straight days with no cough then resumed mostly daytime cough with sensation of pnds but no purulent sputum.  Brought old instructions, not using the new ones    Once gets to Sleep does ok without nocturnal  or early am exacerbation  of respiratory  c/o's or need for noct saba. Also denies any obvious fluctuation of symptoms with weather or environmental changes or other aggravating or alleviating factors except as outlined above   ROS  The following are not active complaints unless bolded sore throat, dysphagia, dental problems, itching, sneezing,  nasal  congestion or excess/ purulent secretions, ear ache,   fever, chills, sweats, unintended wt loss, pleuritic or exertional cp, hemoptysis,  orthopnea pnd or leg swelling, presyncope, palpitations, heartburn, abdominal pain, anorexia, nausea, vomiting, diarrhea  or change in bowel or urinary habits, change in stools or urine, dysuria,hematuria,  rash, arthralgias, visual complaints, headache, numbness weakness or ataxia or problems with walking or coordination,  change in mood/affect or memory.        Objective:   Physical Exam  amb wf moderately  hoarse and harsh honking quality upper airway cough pattern  Wt 156 08/06/2011 > 10/18/2011  151 >11/08/2011  156 > 156 12/07/2011 > 12/27/2011  155  HEENT: nl dentition, turbinates, and orophanx. Nl external ear canals without cough reflex   NECK :  without JVD/Nodes/TM/ nl carotid upstrokes bilaterally   LUNGS: no acc muscle use, mostly pseudowheeze bilaterally, minimal true rhonchi  CV:  RRR  no s3 or murmur or increase in P2, no edema   ABD:  soft and nontender with nl excursion in the supine position. No bruits or organomegaly, bowel sounds nl  MS:  warm without deformities, calf tenderness, cyanosis or clubbing      CXR  08/06/2011 : Slight flattening of diaphragm on lateral image may reflect minimal hyperinflation. There is minimal central peribronchial thickening  Sinus CT 08/06/2011 Mild chronic sinusitis with mucosal edema in the maxillary sinus bilaterally.     Assessment & Plan:

## 2011-12-28 NOTE — Assessment & Plan Note (Signed)
Most likely this is  Classic Upper airway cough syndrome, so named because it's frequently impossible to sort out how much is  CR/sinusitis with freq throat clearing (which can be related to primary GERD)   vs  causing  secondary (" extra esophageal")  GERD from wide swings in gastric pressure that occur with throat clearing, often  promoting self use of mint and menthol lozenges that reduce the lower esophageal sphincter tone and exacerbate the problem further in a cyclical fashion.   These are the same pts (now being labeled as having "irritable larynx syndrome" by some cough centers) who not infrequently have a history of having failed to tolerate ace inhibitors,  dry powder inhalers or biphosphonates or report having atypical reflux symptoms that don't respond to standard doses of PPI , and are easily confused as having aecopd or asthma flares by even experienced allergists/ pulmonologists.  For now needs max gerd rx, eliminate pnds with 1st gen H1 per guidelines.

## 2011-12-28 NOTE — Assessment & Plan Note (Signed)
-   D/c advair 10/18/2011 due to cough   - HFA 12/07/2011 50%  > 50% 12/27/11 p extensive coaching   - Allergy Profile 12/07/11 >  IgE 89 but no specific allergen identified   - CT chest/alpha one AT rec 12/28/2011 >>>  Symptoms are markedly disproportionate to objective findings and not clear this is a lung problem but pt does appear to have difficult airway management issues.  DDX of  difficult airways managment all start with A and  include Adherence, Ace Inhibitors, Acid Reflux, Active Sinus Disease, Alpha 1 Antitripsin deficiency, Anxiety masquerading as Airways dz,  ABPA,  allergy(esp in young), Aspiration (esp in elderly), Adverse effects of DPI,  Active smokers, plus two Bs  = Bronchiectasis and Beta blocker use..and one C= CHF  Adherence is always the initial "prime suspect" and is a multilayered concern that requires a "trust but verify" approach in every patient - starting with knowing how to use medications, especially inhalers, correctly, keeping up with refills and understanding the fundamental difference between maintenance and prns vs those medications only taken for a very short course and then stopped and not refilled. She is not precise enough with meds/ instruction to solve her problems at this point.   To keep things simple, I have asked the patient to first separate medicines that are perceived as maintenance, that is to be taken daily "no matter what", from those medicines that are taken on only on an as-needed basis and I have given the patient examples of both, and then return to see our NP to generate a  detailed  medication calendar which should be followed until the next physician sees the patient and updates it.    The proper method of use, as well as anticipated side effects, of a metered-dose inhaler are discussed and demonstrated to the patient. Improved effectiveness after extensive coaching during this visit to a level of approximately  50% but no better - she was clearly  improving on neb pulmicort/formoterol but refused to resume due to insurance issues/ cost.  ? Alpha one AT > needs genotype  ? Allergy > note IgE elevate but no specific allergen and no response to prednisone > try singulair   ? Bronchiectasis > needs chest ct next

## 2011-12-29 ENCOUNTER — Ambulatory Visit: Payer: 59 | Admitting: Internal Medicine

## 2012-01-10 ENCOUNTER — Ambulatory Visit (INDEPENDENT_AMBULATORY_CARE_PROVIDER_SITE_OTHER)
Admission: RE | Admit: 2012-01-10 | Discharge: 2012-01-10 | Disposition: A | Discharge status: Home / Self Care | Payer: 59 | Source: Ambulatory Visit | Attending: Internal Medicine | Admitting: Internal Medicine

## 2012-01-10 ENCOUNTER — Ambulatory Visit (INDEPENDENT_AMBULATORY_CARE_PROVIDER_SITE_OTHER): Payer: 59 | Admitting: Adult Health

## 2012-01-10 ENCOUNTER — Encounter: Payer: Self-pay | Admitting: Adult Health

## 2012-01-10 VITALS — BP 132/84 | HR 74 | Temp 98.1°F | Ht 62.0 in | Wt 156.8 lb

## 2012-01-10 DIAGNOSIS — J45909 Unspecified asthma, uncomplicated: Secondary | ICD-10-CM

## 2012-01-10 DIAGNOSIS — J479 Bronchiectasis, uncomplicated: Secondary | ICD-10-CM

## 2012-01-10 NOTE — Patient Instructions (Addendum)
Add Delsym 2 tsp Twice daily  For cough  Add Tramadol 50mg  1-2 every 4hr as needed for breakthrough cough  Follow med calendar closely and bring to each visit.  Goal is NO COUGHING -NO THROAT CLEARING  Use sips of water, and sugarless candy. No Mints to avoid coughing and throat clearing.  follow up Dr. Sherene Sires  In 4 weeks and As needed   Please contact office for sooner follow up if symptoms do not improve or worsen or seek emergency care

## 2012-01-11 ENCOUNTER — Encounter: Payer: Self-pay | Admitting: Internal Medicine

## 2012-01-11 DIAGNOSIS — J479 Bronchiectasis, uncomplicated: Secondary | ICD-10-CM | POA: Insufficient documentation

## 2012-01-11 MED ORDER — FLUTTER DEVI: Status: AC

## 2012-01-12 ENCOUNTER — Telehealth: Payer: Self-pay | Admitting: Internal Medicine

## 2012-01-12 NOTE — Assessment & Plan Note (Signed)
Cont on current regimen , add delsym and tramadol for cough  Patient's medications were reviewed today and patient education was given. Computerized medication calendar was adjusted/completed   Plan  Delsym 2 tsp Twice daily  For cough  Add Tramadol 50mg  1-2 every 4hr as needed for breakthrough cough  Follow med calendar closely and bring to each visit.  Goal is NO COUGHING -NO THROAT CLEARING  Use sips of water, and sugarless candy. No Mints to avoid coughing and throat clearing.  follow up Dr. Sherene Sires  In 4 weeks and As needed   Please contact office for sooner follow up if symptoms do not improve or worsen or seek emergency care

## 2012-01-12 NOTE — Progress Notes (Signed)
Quick Note:  Called and spoke with patient informed her Dr, Sherene Sires recs/results as listed below. Patient verbalized understanding. Nothing further needed from patient at this time. And patient already has appt scheduled for 02/18/12. ______

## 2012-01-12 NOTE — Progress Notes (Signed)
Subjective:    Patient ID: Caitlin Newman, female    DOB: [birth year]   MRN: [id number]  HPI 57 yowf no sign smoking hx  With lots of wheezing as child outgrew at very young age (doesn't remember) then recurrent wheezing coughing worse in spring since around 1980 prev seen by [facility] well controlled until Nov 2012 with refractory cough since then so referred 08/06/2011 to pulmonary clinic by PA Ninfa Linden from [place].  08/06/2011 1st pulmonary eval/ Wert cc persistent cough x 6 months acute onset with "flu" in November now present daily  - had been on advair maint and ACEI but the latter was stopped early May 2013 no improvement cough which is worse p eat and afternoons and cough for an hour at bedtime with sense of something stuck upper mid chest and coughs to point of choking/vomiting>  Productive of min yellow mucus. rec Stop advair, tessilon - only use albuterol (ventolin) puffer as rescue Start Pulmicort twice daily with nebulized albuterol until cough completely gone for a couple of weeks then restart the advair  Schedule sinus ct > Mild chronic sinusitis with mucosal edema in the maxillary sinus  bilaterally. Try prilosec 20mg   Take 30-60 min before first meal of the day and Pepcid 20 mg one bedtime until cough is completely gone for at least a week without the need for cough suppression GERD diet Prednisone 10 mg take  4 each am x 2 days,   2 each am x 2 days,  1 each am x2days and stop     10/18/2011 f/u ov/Wert cc got some better but never completely then resumed advair and gradually worsening  Cough again  to the point of choking and vomiting despite rx with  ppi and h2hs- no sob unless coughing Sinus symptoms have been about avg and doesn't use anything for them.  Prednisone did not help previously. rec Stop advair, tessilon - only use albuterol (ventolin) puffer as rescue Start Pulmicort twice daily with nebulized performist until return Cyclical cough rx  11/08/2011 f/u ov/Wert  cc 50% better overall on bud/perfomist x 2 weeks, not needing any other breathing treatments.  Immediately on lying down  feels need to cough mucus up quite thick slt green, min vol, on delsym. By 10 am cough has resumed, once coughs mucus up good to go for the rest of the day but takes up to an hour rec Mucinex 600 mg 2 every 12 hours for congestion / rattling Augmentin twice daily with bafast and supper and yogurt for lunch  Prednisone 10 mg take  4 each am x 2 days,   2 each am x 2 days,  1 each am x2days and stop  Use plenty of nasal saline       12/07/2011 f/u ov/Wert cc pt states prod cough with brown mucus "chunks" has worsened x 2 weeks p changed performist/bud to dulera (poor mdi noted, see below). pt also c/o wheezing.  Staying on prilosec and pepcid thruout.  Coughing so hard loosing her breath.   rec Take mucinex 2  every 12 hours and supplement if needed with  tramadol 50 mg up to 1 every 4 hours to suppress the urge to cough. Once you have eliminated the cough for 3 straight days try reducing the tramadol first,  then the delsym as tolerated.   Augmentin twice daily with bafast and supper and yogurt for lunch  X 10 days Prednisone 10 mg take  4 each am x 2 days,  2 each am x 2 days,  1 each am x2days and stop  Use plenty of nasal saline  Work on inhaler technique:        12/27/2011 f/u ov/Wert cc only went two straight days with no cough then resumed mostly daytime cough with sensation of pnds but no purulent sputum.  Brought old instructions, not using the new ones  >> RX Dulera, PPI /Pepcid , singulair and chlortrimeton , rec CT chest , Alpha 1 and flutter valve    01/10/12 Follow up and med review      Patient returns for a two-week followup and medication review. We reviewed all her medications and organized them into a medication calendar with patient education. Patient has a CT chest scheduled today. She complains that her cough is minimally improved She denies any  hemoptysis, orthopnea, fever, or persistent discolored mucus Seen 2 weeks ago and started on dulera , singulair , chlortrimeton .      Objective:   Physical Exam  amb wf moderately  hoarse and harsh honking quality upper airway cough pattern  Wt 156 08/06/2011 > 10/18/2011  151 >11/08/2011  156 > 156 12/07/2011 > 12/27/2011  155>155 01/10/12   HEENT: nl dentition, turbinates, and orophanx. Nl external ear canals without cough reflex   NECK :  without JVD/Nodes/TM/ nl carotid upstrokes bilaterally   LUNGS: no acc muscle use, mostly pseudowheeze bilaterally, minimal true rhonchi  CV:  RRR  no s3 or murmur or increase in P2, no edema   ABD:  soft and nontender with nl excursion in the supine position. No bruits or organomegaly, bowel sounds nl  MS:  warm without deformities, calf tenderness, cyanosis or clubbing      CXR  08/06/2011 : Slight flattening of diaphragm on lateral image may reflect minimal hyperinflation. There is minimal central peribronchial thickening  Sinus CT 08/06/2011 Mild chronic sinusitis with mucosal edema in the maxillary sinus bilaterally.     Assessment & Plan:

## 2012-01-12 NOTE — Telephone Encounter (Signed)
Called and spoke w patient. Informed of recs/results per Dr. Sherene Sires as listed below. Patient verbalized understanding of this and nothing further needed at this time.  Patient is already scheduled to come in to see Dr. Sherene Sires on 02/18/12.  Result Note     Call patient : Study is c/w bronchiectasis or scarring of bronchial tubes causing cough (treatable but not curable)   No change in rx for now but needs ov with all meds in hand to regroup re longterm care w/in next 4 weeks

## 2012-02-18 ENCOUNTER — Ambulatory Visit (INDEPENDENT_AMBULATORY_CARE_PROVIDER_SITE_OTHER): Payer: 59 | Admitting: Internal Medicine

## 2012-02-18 ENCOUNTER — Encounter: Payer: Self-pay | Admitting: Internal Medicine

## 2012-02-18 ENCOUNTER — Other Ambulatory Visit: Payer: 59

## 2012-02-18 VITALS — BP 150/70 | HR 85 | Temp 98.1°F | Ht 62.0 in | Wt 158.0 lb

## 2012-02-18 DIAGNOSIS — J45909 Unspecified asthma, uncomplicated: Secondary | ICD-10-CM

## 2012-02-18 LAB — ALPHA-1-ANTITRYPSIN: A-1 Antitrypsin, Ser: 122 mg/dL (ref 90–200)

## 2012-02-18 MED ORDER — MUCINEX DM 30-600 MG PO TB12: ORAL_TABLET | ORAL | Status: DC

## 2012-02-18 MED ORDER — BUDESONIDE 0.25 MG/2ML IN SUSP: 0.2500 mg | Freq: Every day | RESPIRATORY_TRACT | Status: DC

## 2012-02-18 MED ORDER — BUDESONIDE 1 MG/2ML IN SUSP: 1.0000 mg | Freq: Two times a day (BID) | RESPIRATORY_TRACT | Status: DC

## 2012-02-18 MED ORDER — AMOXICILLIN-POT CLAVULANATE 875-125 MG PO TABS: 1.0000 | ORAL_TABLET | Freq: Two times a day (BID) | ORAL | Status: DC

## 2012-02-18 MED ORDER — FORMOTEROL FUMARATE 20 MCG/2ML IN NEBU: 20.0000 ug | INHALATION_SOLUTION | Freq: Two times a day (BID) | RESPIRATORY_TRACT | Status: DC

## 2012-02-18 MED ORDER — PREDNISONE (PAK) 10 MG PO TABS: ORAL_TABLET | ORAL | Status: DC

## 2012-02-18 NOTE — Assessment & Plan Note (Signed)
-   D/c advair 10/18/2011 due to cough   - HFA 10% baseline 02/18/2012 > changed to neb laba/ics    - Allergy Profile 12/07/11 >  IgE 89 but no specific allergen identified   - alpha one AT 02/18/2012 >>>   - med calendar 01/10/12   Lack of cough resolution could mean an alternative diagnosis (upper airway cough, not bronchiectatic or asthmatic), persistence of the disease state (bronchiectasis), or inadequacy of currently available therapy (eg no rx available for non-acid gerd) - all 3 may be relevant here  I had an extended discussion with the patient today lasting 15 to 20 minutes of a 25 minute visit on the following issues:     Each maintenance medication was reviewed in detail including most importantly the difference between maintenance and as needed and under what circumstances the prns are to be used. This was done in the context of a medication calendar review which provided the patient with a user-friendly unambiguous mechanism for medication administration and reconciliation and provides an action plan for all active problems. It is critical that this be shown to every doctor  for modification during the office visit if necessary so the patient can use it as a working document.      The proper method of use, as well as anticipated side effects, of a metered-dose inhaler are discussed and demonstrated to the patient. Improved effectiveness after extensive coaching during this visit to a level of approximately  10% so will abandon this route and restart laba/ics neb.  augmentin x 10 days and 6 days prednisone also rec  Stop singulair as not effective

## 2012-02-18 NOTE — Progress Notes (Signed)
Subjective:    Patient ID: Caitlin Newman, female    DOB: 07/29/1945   MRN: [id number]  HPI 68 yowf no sign smoking hx  With lots of wheezing as child outgrew at very young age (doesn't remember) then recurrent wheezing coughing worse in spring since around 1980 prev seen by [facility] well controlled until Nov 2012 with refractory cough since then so referred 08/06/2011 to pulmonary clinic by PA Ninfa Linden from [place].  08/06/2011 1st pulmonary eval/ Raffaela Ladley cc persistent cough x 6 months acute onset with "flu" in November now present daily  - had been on advair maint and ACEI but the latter was stopped early May 2013 no improvement cough which is worse p eat and afternoons and cough for an hour at bedtime with sense of something stuck upper mid chest and coughs to point of choking/vomiting>  Productive of min yellow mucus. rec Stop advair, tessilon - only use albuterol (ventolin) puffer as rescue Start Pulmicort twice daily with nebulized albuterol until cough completely gone for a couple of weeks then restart the advair  Schedule sinus ct > Mild chronic sinusitis with mucosal edema in the maxillary sinus  bilaterally. Try prilosec 20mg   Take 30-60 min before first meal of the day and Pepcid 20 mg one bedtime until cough is completely gone for at least a week without the need for cough suppression GERD diet Prednisone 10 mg take  4 each am x 2 days,   2 each am x 2 days,  1 each am x2days and stop     10/18/2011 f/u ov/Burtis Imhoff cc got some better but never completely then resumed advair and gradually worsening  Cough again  to the point of choking and vomiting despite rx with  ppi and h2hs- no sob unless coughing Sinus symptoms have been about avg and doesn't use anything for them.  Prednisone did not help previously. rec Stop advair, tessilon - only use albuterol (ventolin) puffer as rescue Start Pulmicort twice daily with nebulized performist until return Cyclical cough rx  11/08/2011 f/u ov/Saloni Lablanc  cc 50% better overall on bud/perfomist x 2 weeks, not needing any other breathing treatments.  Immediately on lying down  feels need to cough mucus up quite thick slt green, min vol, on delsym. By 10 am cough has resumed, once coughs mucus up good to go for the rest of the day but takes up to an hour rec Mucinex 600 mg 2 every 12 hours for congestion / rattling Augmentin twice daily with bafast and supper and yogurt for lunch  Prednisone 10 mg take  4 each am x 2 days,   2 each am x 2 days,  1 each am x2days and stop  Use plenty of nasal saline       12/07/2011 f/u ov/Tonja Jezewski cc pt states prod cough with brown mucus "chunks" has worsened x 2 weeks p changed performist/bud to dulera (poor mdi noted, see below). pt also c/o wheezing.  Staying on prilosec and pepcid thruout.  Coughing so hard loosing her breath.   rec Take mucinex 2  every 12 hours and supplement if needed with  tramadol 50 mg up to 1 every 4 hours to suppress the urge to cough. Once you have eliminated the cough for 3 straight days try reducing the tramadol first,  then the delsym as tolerated.   Augmentin twice daily with bafast and supper and yogurt for lunch  X 10 days Prednisone 10 mg take  4 each am x 2 days,  2 each am x 2 days,  1 each am x2days and stop  Use plenty of nasal saline  Work on inhaler technique:        12/27/2011 f/u ov/Adaysha Dubinsky cc only went two straight days with no cough then resumed mostly daytime cough with sensation of pnds but no purulent sputum.  Brought old instructions, not using the new ones  >> RX Dulera, PPI /Pepcid , singulair and chlortrimeton , rec CT chest , Alpha 1 and flutter valve    01/10/12 Follow up and med review      Patient returns for a two-week followup and medication review. We reviewed all her medications and organized them into a medication calendar with patient education. She complains that her cough is minimally improved Seen 2 weeks ago and started on dulera , singulair ,  chlortrimeton .  rec Add Delsym 2 tsp Twice daily  For cough  Add Tramadol 50mg  1-2 every 4hr as needed for breakthrough cough  Goal is NO COUGHING -NO THROAT CLEARING    02/18/2012 f/u ov/Daneisha Surges cc much worse x one week with increase cough/ congestion and yellow mucus, best response was to prednisone, neb laba/ics and augmentin, still not able to use hfa effectively. Also sob with minimal activity. Using med calendar better x doesn't understand how to maximize prns   Sleeping ok without nocturnal  or early am exacerbation  of respiratory  c/o's or need for noct saba. Also denies any obvious fluctuation of symptoms with weather or environmental changes or other aggravating or alleviating factors except as outlined above   ROS  The following are not active complaints unless bolded sore throat, dysphagia, dental problems, itching, sneezing,  nasal congestion or excess/ purulent secretions, ear ache,   fever, chills, sweats, unintended wt loss, pleuritic or exertional cp, hemoptysis,  orthopnea pnd or leg swelling, presyncope, palpitations, heartburn, abdominal pain, anorexia, nausea, vomiting, diarrhea  or change in bowel or urinary habits, change in stools or urine, dysuria,hematuria,  rash, arthralgias, visual complaints, headache, numbness weakness or ataxia or problems with walking or coordination,  change in mood/affect or memory.          Objective:   Physical Exam  amb wf moderately  hoarse and harsh honking quality upper airway cough pattern  Wt 156 08/06/2011  > 156 12/07/2011 > 12/27/2011  155>155 01/10/12 > 02/18/2012  158  HEENT: nl dentition, turbinates, and orophanx. Nl external ear canals without cough reflex   NECK :  without JVD/Nodes/TM/ nl carotid upstrokes bilaterally   LUNGS: no acc muscle use, mostly pseudowheeze bilaterally, minimal true rhonchi  CV:  RRR  no s3 or murmur or increase in P2, no edema   ABD:  soft and nontender with nl excursion in the supine position.  No bruits or organomegaly, bowel sounds nl  MS:  warm without deformities, calf tenderness, cyanosis or clubbing      CXR  08/06/2011 : Slight flattening of diaphragm on lateral image may reflect minimal hyperinflation. There is minimal central peribronchial thickening  Sinus CT 08/06/2011 Mild chronic sinusitis with mucosal edema in the maxillary sinus bilaterally.     Assessment & Plan:

## 2012-02-18 NOTE — Patient Instructions (Addendum)
As per med calendar: Stop singulair and dulera Maximize mucinex dm to 2 every 12 hours as needed for cough and congestion Start performist and budesonide twice daily  Prednisone 10 mg take  4 each am x 2 days,   2 each am x 2 days,  1 each am x2days and stop  Augmentin 875 twice daily x 10 days   Please see patient coordinator before you leave today  to schedule nebulizers through your nebulizer provider to see if this is more affordable Please remember to go to the lab   department downstairs for your tests - we will call you with the results when they are available.  Please schedule a follow up office visit in 4 weeks, sooner if needed

## 2012-02-23 ENCOUNTER — Encounter: Payer: Self-pay | Admitting: Internal Medicine

## 2012-02-24 ENCOUNTER — Other Ambulatory Visit: Payer: Self-pay | Admitting: Internal Medicine

## 2012-02-24 DIAGNOSIS — J45909 Unspecified asthma, uncomplicated: Secondary | ICD-10-CM

## 2012-02-24 MED ORDER — TRAMADOL HCL 50 MG PO TABS: ORAL_TABLET | ORAL | Status: DC

## 2012-02-24 NOTE — Progress Notes (Signed)
Quick Note:  Spoke with pt and notified of results per Dr. Wert. Pt verbalized understanding and denied any questions.  ______ 

## 2012-03-24 ENCOUNTER — Ambulatory Visit (INDEPENDENT_AMBULATORY_CARE_PROVIDER_SITE_OTHER): Payer: 59 | Admitting: Internal Medicine

## 2012-03-24 ENCOUNTER — Encounter: Payer: Self-pay | Admitting: Internal Medicine

## 2012-03-24 VITALS — BP 138/78 | HR 90 | Temp 97.9°F | Ht 62.0 in | Wt 157.6 lb

## 2012-03-24 DIAGNOSIS — J479 Bronchiectasis, uncomplicated: Secondary | ICD-10-CM

## 2012-03-24 DIAGNOSIS — J45909 Unspecified asthma, uncomplicated: Secondary | ICD-10-CM

## 2012-03-24 NOTE — Assessment & Plan Note (Signed)
-    CT chest 01/10/12 1. Bilateral bronchiectasis is most evident in the left lower lobe where ground-glass attenuation and scarring is evident. No significant airspace consolidation is present. 2. Upper lobe bronchiectasis is slightly worse on the right. No significant parenchymal disease is associated. 3. Single calcified right paratracheal node may represent treated disease or a history granulomatous disease  Therefore likely won't be able to normalize lung function or eliminate symptoms but does seem to be near optimal level of control on present rx, reviewed need for baseline pft's next ov

## 2012-03-24 NOTE — Patient Instructions (Addendum)
See calendar for specific medication instructions and bring it back for each and every office visit for every healthcare provider you see.  Without it,  you may not receive the best quality medical care that we feel you deserve.  You will note that the calendar groups together  your maintenance  medications that are timed at particular times of the day.  Think of this as your checklist for what your doctor has instructed you to do until your next evaluation to see what benefit  there is  to staying on a consistent group of medications intended to keep you well.  The other group at the bottom is entirely up to you to use as you see fit  for specific symptoms that may arise between visits that require you to treat them on an as needed basis.  Think of this as your action plan or "what if" list.   Separating the top medications from the bottom group is fundamental to providing you adequate care going forward.    GERD (REFLUX)  is an extremely common cause of respiratory symptoms, many times with no significant heartburn at all.    It can be treated with medication, but also with lifestyle changes including avoidance of late meals, excessive alcohol, smoking cessation, and avoid fatty foods, chocolate, peppermint, colas, red wine, and acidic juices such as orange juice.  NO MINT OR MENTHOL PRODUCTS SO NO COUGH DROPS  USE SUGARLESS CANDY INSTEAD (jolley ranchers or Stover's)  NO OIL BASED VITAMINS - use powdered substitutes.  Please see patient coordinator before you leave today  to schedule new nebulizer   Please schedule a follow up office visit in 6 weeks, call sooner if needed with pfts

## 2012-03-24 NOTE — Progress Notes (Signed)
Subjective:    Patient ID: Caitlin Newman, female    DOB: [birth year]   MRN: [id number]  HPI 57 yowf no sign smoking hx  With lots of wheezing as child outgrew at very young age (doesn't remember) then recurrent wheezing coughing worse in spring since around 1980 prev seen by [facility] well controlled until Nov 2012 with refractory cough since then so referred 08/06/2011 to pulmonary clinic by PA Ninfa Linden from [place].  08/06/2011 1st pulmonary eval/ Kitti Mcclish cc persistent cough x 6 months acute onset with "flu" in November now present daily  - had been on advair maint and ACEI but the latter was stopped early May 2013 no improvement cough which is worse p eat and afternoons and cough for an hour at bedtime with sense of something stuck upper mid chest and coughs to point of choking/vomiting>  Productive of min yellow mucus. rec Stop advair, tessilon - only use albuterol (ventolin) puffer as rescue Start Pulmicort twice daily with nebulized albuterol until cough completely gone for a couple of weeks then restart the advair  Schedule sinus ct > Mild chronic sinusitis with mucosal edema in the maxillary sinus  bilaterally. Try prilosec 20mg   Take 30-60 min before first meal of the day and Pepcid 20 mg one bedtime until cough is completely gone for at least a week without the need for cough suppression GERD diet Prednisone 10 mg take  4 each am x 2 days,   2 each am x 2 days,  1 each am x2days and stop     10/18/2011 f/u ov/Karlen Barbar cc got some better but never completely then resumed advair and gradually worsening  Cough again  to the point of choking and vomiting despite rx with  ppi and h2hs- no sob unless coughing Sinus symptoms have been about avg and doesn't use anything for them.  Prednisone did not help previously. rec Stop advair, tessilon - only use albuterol (ventolin) puffer as rescue Start Pulmicort twice daily with nebulized performist until return Cyclical cough rx  11/08/2011 f/u ov/Sharrod Achille  cc 50% better overall on bud/perfomist x 2 weeks, not needing any other breathing treatments.  Immediately on lying down  feels need to cough mucus up quite thick slt green, min vol, on delsym. By 10 am cough has resumed, once coughs mucus up good to go for the rest of the day but takes up to an hour rec Mucinex 600 mg 2 every 12 hours for congestion / rattling Augmentin twice daily with bafast and supper and yogurt for lunch  Prednisone 10 mg take  4 each am x 2 days,   2 each am x 2 days,  1 each am x2days and stop  Use plenty of nasal saline       12/07/2011 f/u ov/Macil Crady cc pt states prod cough with brown mucus "chunks" has worsened x 2 weeks p changed performist/bud to dulera (poor mdi noted, see below). pt also c/o wheezing.  Staying on prilosec and pepcid thruout.  Coughing so hard loosing her breath.   rec Take mucinex 2  every 12 hours and supplement if needed with  tramadol 50 mg up to 1 every 4 hours to suppress the urge to cough. Once you have eliminated the cough for 3 straight days try reducing the tramadol first,  then the delsym as tolerated.   Augmentin twice daily with bafast and supper and yogurt for lunch  X 10 days Prednisone 10 mg take  4 each am x 2 days,  2 each am x 2 days,  1 each am x2days and stop  Use plenty of nasal saline  Work on inhaler technique:        12/27/2011 f/u ov/Reise Gladney cc only went two straight days with no cough then resumed mostly daytime cough with sensation of pnds but no purulent sputum.  Brought old instructions, not using the new ones  >> RX Dulera, PPI /Pepcid , singulair and chlortrimeton , rec CT chest , Alpha 1 and flutter valve    01/10/12 Follow up and med review      Patient returns for a two-week followup and medication review. We reviewed all her medications and organized them into a medication calendar with patient education. She complains that her cough is minimally improved Seen 2 weeks ago and started on dulera , singulair ,  chlortrimeton .  rec Add Delsym 2 tsp Twice daily  For cough  Add Tramadol 50mg  1-2 every 4hr as needed for breakthrough cough  Goal is NO COUGHING -NO THROAT CLEARING    02/18/2012 f/u ov/Tate Zagal cc much worse x one week with increase cough/ congestion and yellow mucus, best response was to prednisone, neb laba/ics and augmentin, still not able to use hfa effectively. Also sob with minimal activity. Using med calendar better x doesn't understand how to maximize prns  rec As per med calendar: Stop singulair and dulera Maximize mucinex dm to 2 every 12 hours as needed for cough and congestion Start performist and budesonide twice daily Prednisone 10 mg take  4 each am x 2 days,   2 each am x 2 days,  1 each am x2days and stop  Augmentin 875 twice daily x 10 days   03/24/2012 f/u ov/Grainger Mccarley using calendar, cc much better breathing/ cough on performist/ bud still needs ventolin 2 x weekly and still somewhat hoarse.  No obvious daytime variabilty or assoc chronic cough or cp or chest tightness, subjective wheeze overt sinus or hb symptoms. No unusual exp hx   Sleeping ok without nocturnal  or early am exacerbation  of respiratory  c/o's or need for noct saba. Also denies any obvious fluctuation of symptoms with weather or environmental changes or other aggravating or alleviating factors except as outlined above   ROS  The following are not active complaints unless bolded sore throat, dysphagia, dental problems, itching, sneezing,  nasal congestion or excess/ purulent secretions, ear ache,   fever, chills, sweats, unintended wt loss, pleuritic or exertional cp, hemoptysis,  orthopnea pnd or leg swelling, presyncope, palpitations, heartburn, abdominal pain, anorexia, nausea, vomiting, diarrhea  or change in bowel or urinary habits, change in stools or urine, dysuria,hematuria,  rash, arthralgias, visual complaints, headache, numbness weakness or ataxia or problems with walking or coordination,  change in  mood/affect or memory.          Objective:   Physical Exam  amb wf moderately  hoarse  Otherwise nad/ all smiles  Wt 156 08/06/2011  > 156 12/07/2011 > 12/27/2011  155>155 01/10/12 > 02/18/2012  158 > 157 03/24/2012   HEENT: nl dentition, turbinates, and orophanx. Nl external ear canals without cough reflex   NECK :  without JVD/Nodes/TM/ nl carotid upstrokes bilaterally   LUNGS: no acc muscle use, mostly pseudowheeze bilaterally, minimal true rhonchi  CV:  RRR  no s3 or murmur or increase in P2, no edema   ABD:  soft and nontender with nl excursion in the supine position. No bruits or organomegaly, bowel sounds nl  MS:  warm without deformities, calf tenderness, cyanosis or clubbing      CXR  08/06/2011 : Slight flattening of diaphragm on lateral image may reflect minimal hyperinflation. There is minimal central peribronchial thickening  Sinus CT 08/06/2011 Mild chronic sinusitis with mucosal edema in the maxillary sinus bilaterally.     Assessment & Plan:

## 2012-03-24 NOTE — Assessment & Plan Note (Signed)
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 This was done in the context of a medication calendar review which provided the patient with a user-friendly unambiguous mechanism for medication administration and reconciliation and provides an action plan for all active problems. It is critical that this be shown to every doctor  for modification during the office visit if necessary so the patient can use it as a working document.      Next step is pft's

## 2012-03-31 ENCOUNTER — Telehealth: Payer: Self-pay | Admitting: Internal Medicine

## 2012-03-31 NOTE — Telephone Encounter (Signed)
Spoke with pt and she now thinks that someone called her regarding a friend's oxygen that she is taking on a trip.  Pt will contact us if anything further is needed.

## 2012-05-05 ENCOUNTER — Ambulatory Visit: Payer: 59 | Admitting: Internal Medicine

## 2012-05-08 ENCOUNTER — Ambulatory Visit: Payer: 59 | Admitting: Internal Medicine

## 2012-07-25 ENCOUNTER — Telehealth: Payer: Self-pay | Admitting: Internal Medicine

## 2012-07-25 NOTE — Telephone Encounter (Signed)
Spoke with patient, she had questions concerning the directions of her budesonide and formoterol nebulizers. I have reiterated the directions of these as listed on her medication profile. Pt verbalized understanding and nothing further needed at this time.

## 2012-10-02 ENCOUNTER — Telehealth: Payer: Self-pay | Admitting: Internal Medicine

## 2012-10-02 NOTE — Telephone Encounter (Signed)
Spoke with patient, patient states she has switched to Medicare and needs Rx for budesonide and perforomist sent to [facility] for them to keep on file  Spoke with pharmacist at [facility] to verify this and per pharmacist patient has 5 refills left and they will not need an "on hold" rx Patient needs nothing from our office at this time  Will close msg

## 2012-10-09 ENCOUNTER — Ambulatory Visit: Payer: 59 | Admitting: Internal Medicine

## 2012-11-01 ENCOUNTER — Ambulatory Visit (INDEPENDENT_AMBULATORY_CARE_PROVIDER_SITE_OTHER): Payer: Medicare Other | Admitting: Internal Medicine

## 2012-11-01 ENCOUNTER — Encounter: Payer: Self-pay | Admitting: Internal Medicine

## 2012-11-01 VITALS — BP 122/70 | HR 80 | Temp 98.2°F | Ht 60.0 in | Wt 149.0 lb

## 2012-11-01 DIAGNOSIS — J479 Bronchiectasis, uncomplicated: Secondary | ICD-10-CM

## 2012-11-01 DIAGNOSIS — J45909 Unspecified asthma, uncomplicated: Secondary | ICD-10-CM

## 2012-11-01 DIAGNOSIS — R05 Cough: Secondary | ICD-10-CM

## 2012-11-01 LAB — PULMONARY FUNCTION TEST

## 2012-11-01 MED ORDER — MOMETASONE FURO-FORMOTEROL FUM 100-5 MCG/ACT IN AERO: INHALATION_SPRAY | RESPIRATORY_TRACT | Status: DC

## 2012-11-01 NOTE — Assessment & Plan Note (Signed)
-    CT chest 01/10/12 1. Bilateral bronchiectasis is most evident in the left lower lobe where ground-glass attenuation and scarring is evident. No significant airspace consolidation is present. 2. Upper lobe bronchiectasis is slightly worse on the right. No significant parenchymal disease is associated. 3. Single calcified right paratracheal node may represent treated disease or a history granulomatous disease. Alpha one AT >  MM  02/2012  PFT"s wnl 11/01/2012   Cough is controlled on present rx s recent bronchiectasis flare

## 2012-11-01 NOTE — Assessment & Plan Note (Signed)
Adequate control on present rx, reviewed > no change in rx needed      Each maintenance medication was reviewed in detail including most importantly the difference between maintenance and as needed and under what circumstances the prns are to be used.  Please see instructions for details which were reviewed in writing and the patient given a copy.     Needs new med calendar next and follow to the letter if she wants best care

## 2012-11-01 NOTE — Progress Notes (Signed)
PFT done today. 

## 2012-11-01 NOTE — Progress Notes (Signed)
Subjective:    Patient ID: Caitlin Newman, female    DOB: 06/18/1945   MRN: [id number]  Brief patient profile:  67 yowf no sign smoking hx  With lots of wheezing as child outgrew at very young age (doesn't remember) then recurrent wheezing coughing worse in spring since around 1980 [facility] until [facility] 2012 with refractory cough since then so referred 08/06/2011 to pulmonary clinic by [place] Ninfa [place]athy [place] with nl pfts 11/01/2012 including [id number]   08/06/2011 1st pulmonary eval/ Caitlin Newman cc persistent cough x 6 months acute onset with "flu" in November now present daily  - had been on advair maint and ACEI but the latter was stopped early May 2013 no improvement cough which is worse p eat and afternoons and cough for an hour at bedtime with sense of something stuck upper mid chest and coughs to point of choking/vomiting>  Productive of min yellow mucus. rec Stop advair, tessilon - only use albuterol (ventolin) puffer as rescue Start Pulmicort twice daily with nebulized albuterol until cough completely gone for a couple of weeks then restart the advair  Schedule sinus ct > Mild chronic sinusitis with mucosal edema in the maxillary sinus  bilaterally. Try prilosec 20mg   Take 30-60 min before first meal of the day and Pepcid 20 mg one bedtime until cough is completely gone for at least a week without the need for cough suppression GERD diet Prednisone 10 mg take  4 each am x 2 days,   2 each am x 2 days,  1 each am x2days and stop     10/18/2011 f/u ov/Ragen Laver cc got some better but never completely then resumed advair and gradually worsening  Cough again  to the point of choking and vomiting despite rx with  ppi and h2hs- no sob unless coughing Sinus symptoms have been about avg and doesn't use anything for them.  Prednisone did not help previously. rec Stop advair, tessilon - only use albuterol (ventolin) puffer as rescue Start Pulmicort twice daily with nebulized  performist until return Cyclical cough rx  01/10/12 Follow up and med review    Patient returns for a two-week followup and medication review. We reviewed all her medications and organized them into a medication calendar with patient education. She complains that her cough is minimally improved Seen 2 weeks ago and started on dulera , singulair , chlortrimeton .  rec Add Delsym 2 tsp Twice daily  For cough  Add Tramadol 50mg  1-2 every 4hr as needed for breakthrough cough  Goal is NO COUGHING -NO THROAT CLEARING    02/18/2012 f/u ov/Caitlin Newman cc much worse x one week with increase cough/ congestion and yellow mucus, best response was to prednisone, neb laba/ics and augmentin, still not able to use hfa effectively. Also sob with minimal activity. Using med calendar better x doesn't understand how to maximize prns  rec As per med calendar: Stop singulair and dulera Maximize mucinex dm to 2 every 12 hours as needed for cough and congestion Start performist and budesonide twice daily Prednisone 10 mg take  4 each am x 2 days,   2 each am x 2 days,  1 each am x2days and stop  Augmentin 875 twice daily x 10 days   03/24/2012 f/u ov/Caitlin Newman using calendar, cc much better breathing/ cough on performist/ bud still needs ventolin 2 x weekly and still somewhat hoarse. rec No change, follow med calendar   11/01/2012 f/u ov/Caitlin Newman no longer using med calendar,  off pulmicort x 10/16/12 just using performist due to cost Chief Complaint  Patient presents with  . Asthma    Breathing is doing well. Reports SOB. Denies coughing or chest tightness.  only wheezing with heavy exertion worse off ics with increase need for saba    No obvious daytime variabilty or assoc chronic cough or cp or chest tightness, subjective wheeze overt sinus or hb symptoms. No unusual exp hx   Sleeping ok without nocturnal  or early am exacerbation  of respiratory  c/o's or need for noct saba. Also denies any obvious fluctuation of  symptoms with weather or environmental changes or other aggravating or alleviating factors except as outlined above   ROS  The following are not active complaints unless bolded sore throat, dysphagia, dental problems, itching, sneezing,  nasal congestion or excess/ purulent secretions, ear ache,   fever, chills, sweats, unintended wt loss, pleuritic or exertional cp, hemoptysis,  orthopnea pnd or leg swelling, presyncope, palpitations, heartburn, abdominal pain, anorexia, nausea, vomiting, diarrhea  or change in bowel or urinary habits, change in stools or urine, dysuria,hematuria,  rash, arthralgias, visual complaints, headache, numbness weakness or ataxia or problems with walking or coordination,  change in mood/affect or memory.          Objective:   Physical Exam  amb wf mildly hoarse  Otherwise nad  Wt 156 08/06/2011  > 156 12/07/2011 > 12/27/2011  155>155 01/10/12 > 02/18/2012  158 > 157 03/24/2012  > 11/01/2012   149  HEENT: nl dentition, turbinates, and orophanx. Nl external ear canals without cough reflex   NECK :  without JVD/Nodes/TM/ nl carotid upstrokes bilaterally   LUNGS: no acc muscle use, mostly pseudowheeze bilaterally, minimal true rhonchi  CV:  RRR  no s3 or murmur or increase in P2, no edema   ABD:  soft and nontender with nl excursion in the supine position. No bruits or organomegaly, bowel sounds nl  MS:  warm without deformities, calf tenderness, cyanosis or clubbing      CXR  08/06/2011 : Slight flattening of diaphragm on lateral image may reflect minimal hyperinflation. There is minimal central peribronchial thickening  Sinus CT 08/06/2011 Mild chronic sinusitis with mucosal edema in the maxillary sinus bilaterally.     Assessment & Plan:

## 2012-11-01 NOTE — Patient Instructions (Addendum)
dulera 100 Take 2 puffs first thing in am and then another 2 puffs about 12 hours later and stop all nebulizers    Only use your albuterol as a rescue medication to be used if you can't catch your breath by resting or doing a relaxed purse lip breathing pattern. The less you use it, the better it will work when you need it.   See Tammy NP w/in 2 weeks with all your medications, even over the counter meds, separated in two separate bags, the ones you take no matter what vs the ones you stop once you feel better and take only as needed when you feel you need them.   Tammy  will generate for you a new user friendly medication calendar that will put [place] all on the same page re: your medication use.     Without this process, it simply isn't possible to assure that we are providing  your outpatient care  with  the attention to detail we feel you deserve.   If we cannot assure that you're getting that kind of care,  then we cannot manage your problem effectively from this clinic.  Once you have seen Tammy and we are sure that we're all on the same page with your medication use she will arrange follow up with me.

## 2012-11-01 NOTE — Assessment & Plan Note (Signed)
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 Improved effectiveness after extensive coaching during this visit to a level of approximately  75% from a baseline of 25%

## 2012-11-02 ENCOUNTER — Ambulatory Visit: Payer: Medicare Other | Admitting: Internal Medicine

## 2012-11-08 ENCOUNTER — Encounter: Payer: Self-pay | Admitting: Internal Medicine

## 2012-11-20 ENCOUNTER — Ambulatory Visit (INDEPENDENT_AMBULATORY_CARE_PROVIDER_SITE_OTHER): Payer: Medicare Other | Admitting: Adult Health

## 2012-11-20 ENCOUNTER — Encounter: Payer: Self-pay | Admitting: Adult Health

## 2012-11-20 VITALS — BP 108/64 | HR 67 | Temp 97.4°F | Ht 62.0 in | Wt 150.4 lb

## 2012-11-20 DIAGNOSIS — J479 Bronchiectasis, uncomplicated: Secondary | ICD-10-CM

## 2012-11-20 DIAGNOSIS — J45909 Unspecified asthma, uncomplicated: Secondary | ICD-10-CM

## 2012-11-20 DIAGNOSIS — Z23 Encounter for immunization: Secondary | ICD-10-CM

## 2012-11-20 NOTE — Assessment & Plan Note (Signed)
Compensated without exacerbation

## 2012-11-20 NOTE — Progress Notes (Signed)
Subjective:    Patient ID: Caitlin Newman, female    DOB: 06/18/1945   MRN: [id number]  Brief patient profile:  67 yowf no sign smoking hx  With lots of wheezing as child outgrew at very young age (doesn't remember) then recurrent wheezing coughing worse in spring since around 1980 [facility] until Nov 2012 with refractory cough since then so referred 08/06/2011 to pulmonary clinic by PA Ninfa [place]athy [place] from [place] with nl pfts 11/01/2012 including [id number]   08/06/2011 1st pulmonary eval/ Wert cc persistent cough x 6 months acute onset with "flu" in November now present daily  - had been on advair maint and ACEI but the latter was stopped early May 2013 no improvement cough which is worse p eat and afternoons and cough for an hour at bedtime with sense of something stuck upper mid chest and coughs to point of choking/vomiting>  Productive of min yellow mucus. rec Stop advair, tessilon - only use albuterol (ventolin) puffer as rescue Start Pulmicort twice daily with nebulized albuterol until cough completely gone for a couple of weeks then restart the advair  Schedule sinus ct > Mild chronic sinusitis with mucosal edema in the maxillary sinus  bilaterally. Try prilosec 20mg   Take 30-60 min before first meal of the day and Pepcid 20 mg one bedtime until cough is completely gone for at least a week without the need for cough suppression GERD diet Prednisone 10 mg take  4 each am x 2 days,   2 each am x 2 days,  1 each am x2days and stop     10/18/2011 f/u ov/Wert cc got some better but never completely then resumed advair and gradually worsening  Cough again  to the point of choking and vomiting despite rx with  ppi and h2hs- no sob unless coughing Sinus symptoms have been about avg and doesn't use anything for them.  Prednisone did not help previously. rec Stop advair, tessilon - only use albuterol (ventolin) puffer as rescue Start Pulmicort twice daily with nebulized  performist until return Cyclical cough rx  01/10/12 Follow up and med review    Patient returns for a two-week followup and medication review. We reviewed all her medications and organized them into a medication calendar with patient education. She complains that her cough is minimally improved Seen 2 weeks ago and started on dulera , singulair , chlortrimeton .  rec Add Delsym 2 tsp Twice daily  For cough  Add Tramadol 50mg  1-2 every 4hr as needed for breakthrough cough  Goal is NO COUGHING -NO THROAT CLEARING    02/18/2012 f/u ov/Wert cc much worse x one week with increase cough/ congestion and yellow mucus, best response was to prednisone, neb laba/ics and augmentin, still not able to use hfa effectively. Also sob with minimal activity. Using med calendar better x doesn't understand how to maximize prns  rec As per med calendar: Stop singulair and dulera Maximize mucinex dm to 2 every 12 hours as needed for cough and congestion Start performist and budesonide twice daily Prednisone 10 mg take  4 each am x 2 days,   2 each am x 2 days,  1 each am x2days and stop  Augmentin 875 twice daily x 10 days   03/24/2012 f/u ov/Wert using calendar, cc much better breathing/ cough on performist/ bud still needs ventolin 2 x weekly and still somewhat hoarse. rec No change, follow med calendar   11/01/2012 f/u ov/Wert no longer using med calendar,  off pulmicort x 10/16/12 just using performist due to cost Chief Complaint  Patient presents with  . Asthma    Breathing is doing well. Reports SOB. Denies coughing or chest tightness.  only wheezing with heavy exertion worse off ics with increase need for saba  >>d/c nebs, retry Dulera   11/20/2012 Follow up and Med review  Patient returns for a two-week medication review. Reviewed all her medications and organized them into a medication calendar with patient education. Appears that she is taking her medications correctly. Patient was changed over  to [facility] from Budesonide/Brovana Nebs last ov.   Says that she is doing well on Dulera. No flare in wheezing , cough or dyspnea.  She is fully retired now. And she has rare use of her Ventolin inhaler. Patient denies any hemoptysis, orthopnea, PND, or leg swelling      ROS  The following are not active complaints unless bolded sore throat, dysphagia, dental problems, itching, sneezing,  nasal congestion or excess/ purulent secretions, ear ache,   fever, chills, sweats, unintended wt loss, pleuritic or exertional cp, hemoptysis,  orthopnea pnd or leg swelling, presyncope, palpitations, heartburn, abdominal pain, anorexia, nausea, vomiting, diarrhea  or change in bowel or urinary habits, change in stools or urine, dysuria,hematuria,  rash, arthralgias, visual complaints, headache, numbness weakness or ataxia or problems with walking or coordination,  change in mood/affect or memory.          Objective:   Physical Exam  amb wf mildly  Otherwise nad  Wt 156 08/06/2011  > 156 12/07/2011 > 12/27/2011  155>155 01/10/12 > 02/18/2012  158 > 157 03/24/2012  > 11/01/2012   149 > 150 11/20/2012  HEENT: nl dentition, turbinates, and orophanx. Nl external ear canals without cough reflex   NECK :  without JVD/Nodes/TM/ nl carotid upstrokes bilaterally   LUNGS: no acc muscle use, CTA , no wheezing   CV:  RRR  no s3 or murmur or increase in P2, no edema   ABD:  soft and nontender with nl excursion in the supine position. No bruits or organomegaly, bowel sounds nl  MS:  warm without deformities, calf tenderness, cyanosis or clubbing      CXR  08/06/2011 : Slight flattening of diaphragm on lateral image may reflect minimal hyperinflation. There is minimal central peribronchial thickening  Sinus CT 08/06/2011 Mild chronic sinusitis with mucosal edema in the maxillary sinus bilaterally.    CT chest 12/27/11 >Bilateral bronchiectasis is most evident in the left lower lobe  where ground-glass  attenuation and scarring is evident. No  significant airspace consolidation is present.  2. Upper lobe bronchiectasis is slightly worse on the right. No  significant parenchymal disease is associated.  Assessment & Plan:

## 2012-11-20 NOTE — Assessment & Plan Note (Signed)
Compensated on present regimen without, failure Shot today. Patient's medications were reviewed today and patient education was given. Computerized medication calendar was adjusted/completed  Follow up in 3 months and as needed

## 2012-11-20 NOTE — Patient Instructions (Addendum)
Follow med calendar closely and bring to each visit.  Follow up Dr. Sherene Sires  In 3 months  Flu shot today

## 2012-11-21 NOTE — Addendum Note (Signed)
Addended by: Boone Master E on: 11/21/2012 12:13 PM   Modules accepted: Orders

## 2013-04-06 ENCOUNTER — Telehealth: Payer: Self-pay | Admitting: Internal Medicine

## 2013-04-06 NOTE — Telephone Encounter (Signed)
LM Xs to to sched appt w/ MW.  (unsure of dates).  [name]

## 2013-04-10 NOTE — Telephone Encounter (Signed)
Sending letter to schedule appt.  Caitlin Newman

## 2013-04-25 ENCOUNTER — Encounter: Payer: Self-pay | Admitting: Internal Medicine

## 2013-04-25 ENCOUNTER — Ambulatory Visit (INDEPENDENT_AMBULATORY_CARE_PROVIDER_SITE_OTHER): Payer: Medicare Other | Admitting: Internal Medicine

## 2013-04-25 VITALS — BP 124/80 | HR 82 | Temp 97.8°F | Ht 62.0 in | Wt 153.8 lb

## 2013-04-25 DIAGNOSIS — J45909 Unspecified asthma, uncomplicated: Secondary | ICD-10-CM

## 2013-04-25 DIAGNOSIS — R059 Cough, unspecified: Secondary | ICD-10-CM

## 2013-04-25 DIAGNOSIS — R05 Cough: Secondary | ICD-10-CM

## 2013-04-25 MED ORDER — TRAMADOL HCL 50 MG PO TABS: ORAL_TABLET | ORAL | Status: DC

## 2013-04-25 MED ORDER — AMOXICILLIN-POT CLAVULANATE 875-125 MG PO TABS: 1.0000 | ORAL_TABLET | Freq: Two times a day (BID) | ORAL | Status: DC

## 2013-04-25 MED ORDER — PREDNISONE 10 MG PO TABS: ORAL_TABLET | ORAL | Status: DC

## 2013-04-25 MED ORDER — FAMOTIDINE 20 MG PO TABS: ORAL_TABLET | ORAL | Status: DC

## 2013-04-25 NOTE — Assessment & Plan Note (Signed)
Cough is disproportionate to lower airway issues and has developed a cyclical component  rec use of flutter and supplement with tramadol  rx augmentin/ pred x 6 in case element of sinusitis as well and low threshold to sinus ct if not better

## 2013-04-25 NOTE — Assessment & Plan Note (Addendum)
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 - hfa 75% p coaching 11/01/2012 and can't afford pulmocort neb > rechallenged with dulera 100 2bid  -Med calendar 11/20/2012  04/25/2013 p extensive coaching HFA effectiveness =    75%   DDX of  difficult airways managment all start with A and  include Adherence, Ace Inhibitors, Acid Reflux, Active Sinus Disease, Alpha 1 Antitripsin deficiency, Anxiety masquerading as Airways dz,  ABPA,  allergy(esp in young), Aspiration (esp in elderly), Adverse effects of DPI,  Active smokers, plus two Bs  = Bronchiectasis and Beta blocker use..and one C= CHF  In this case Adherence is the biggest issue and starts with  inability to use HFA effectively and also  understand that SABA treats the symptoms but doesn't get to the underlying problem (inflammation).  I used  the analogy of putting steroid cream on a rash to help explain the meaning of topical therapy and the need to get the drug to the target tissue.   Since improved with teaching on how to use hfa will continue dulera 100 but low threshold to change to laba/ics neb if fails to maintain a reasonable level of effectiveness with topical rx

## 2013-04-25 NOTE — Progress Notes (Signed)
Subjective:    Patient ID: Caitlin BradleyWanna Gertsch, female    DOB: 06/18/1945   MRN: [id number]  Brief patient profile:  67 yowf no sign smoking hx  With lots of wheezing as child outgrew at very young age (doesn't remember) then recurrent wheezing coughing worse in spring since around 1980 [facility] until Nov 2012 with refractory cough since then so referred 08/06/2011 to pulmonary clinic by PA Ninfa [place]athy [place] with nl pfts 11/01/2012 including [id number]   08/06/2011 1st pulmonary eval/ Natasa Stigall cc persistent cough x 6 months acute onset with "flu" in November now present daily  - had been on advair maint and ACEI but the latter was stopped early May 2013 no improvement cough which is worse p eat and afternoons and cough for an hour at bedtime with sense of something stuck upper mid chest and coughs to point of choking/vomiting>  Productive of min yellow mucus. rec Stop advair, tessilon - only use albuterol (ventolin) puffer as rescue Start Pulmicort twice daily with nebulized albuterol until cough completely gone for a couple of weeks then restart the advair  Schedule sinus ct > Mild chronic sinusitis with mucosal edema in the maxillary sinus  bilaterally. Try prilosec 20mg   Take 30-60 min before first meal of the day and Pepcid 20 mg one bedtime until cough is completely gone for at least a week without the need for cough suppression GERD diet Prednisone 10 mg take  4 each am x 2 days,   2 each am x 2 days,  1 each am x2days and stop     10/18/2011 f/u ov/Leaner Morici cc got some better but never completely then resumed advair and gradually worsening  Cough again  to the point of choking and vomiting despite rx with  ppi and h2hs- no sob unless coughing Sinus symptoms have been about avg and doesn't use anything for them.  Prednisone did not help previously. rec Stop advair, tessilon - only use albuterol (ventolin) puffer as rescue Start Pulmicort twice daily with nebulized  performist until return Cyclical cough rx  01/10/12 Follow up and med review    Patient returns for a two-week followup and medication review. We reviewed all her medications and organized them into a medication calendar with patient education. She complains that her cough is minimally improved Seen 2 weeks ago and started on dulera , singulair , chlortrimeton .  rec Add Delsym 2 tsp Twice daily  For cough  Add Tramadol 50mg  1-2 every 4hr as needed for breakthrough cough  Goal is NO COUGHING -NO THROAT CLEARING    02/18/2012 f/u ov/Lydell Moga cc much worse x one week with increase cough/ congestion and yellow mucus, best response was to prednisone, neb laba/ics and augmentin, still not able to use hfa effectively. Also sob with minimal activity. Using med calendar better x doesn't understand how to maximize prns  rec As per med calendar: Stop singulair and dulera Maximize mucinex dm to 2 every 12 hours as needed for cough and congestion Start performist and budesonide twice daily Prednisone 10 mg take  4 each am x 2 days,   2 each am x 2 days,  1 each am x2days and stop  Augmentin 875 twice daily x 10 days   03/24/2012 f/u ov/Ahliyah Nienow using calendar, cc much better breathing/ cough on performist/ bud still needs ventolin 2 x weekly and still somewhat hoarse. rec No change, follow med calendar   11/01/2012 f/u ov/Nanako Stopher no longer using med calendar,  off pulmicort x 10/16/12 just using performist due to cost Chief Complaint  Patient presents with  . Asthma    Breathing is doing well. Reports SOB. Denies coughing or chest tightness.  only wheezing with heavy exertion worse off ics with increase need for saba  >>d/c nebs, retry Dulera   11/20/2012 Follow up and Med review  Patient returns for a two-week medication review. Reviewed all her medications and organized them into a medication calendar with patient education. Appears that she is taking her medications correctly. Patient was changed over  to [facility] from Budesonide/Brovana Nebs last ov.   Says that she is doing well on Dulera. No flare in wheezing , cough or dyspnea.  She is fully retired now. And she has rare use of her Ventolin inhaler.   04/25/2013 f/u ov/Lamarion Mcevers re: refractory cough maint on dulera 100 with poor hfa  Chief Complaint  Patient presents with  . Follow-up    Cough worse since end of Dec- prod with minimal yellow sputum.  She also c/o PND. Using ventolin approx bid.   did well 11/20/12 ov to x mas then caught cold, got neb alb and traveled to [place] and back with persistent hacking daytime cough and using the neb avg twice daily for relief of sob with more than adls.  No obvious day to day or daytime variabilty or assoc   cp or chest tightness, subjective wheeze overt sinus or hb symptoms. No unusual exp hx or h/o childhood pna/ asthma or knowledge of premature birth.  Sleeping ok without nocturnal  or early am exacerbation  of respiratory  c/o's or need for noct saba. Also denies any obvious fluctuation of symptoms with weather or environmental changes or other aggravating or alleviating factors except as outlined above   Current Medications, Allergies, Complete Past Medical History, Past Surgical History, Family History, and Social History were reviewed in [employer] record.  ROS  The following are not active complaints unless bolded sore throat, dysphagia, dental problems, itching, sneezing,  nasal congestion or excess/ purulent secretions, ear ache,   fever, chills, sweats, unintended wt loss, pleuritic or exertional cp, hemoptysis,  orthopnea pnd or leg swelling, presyncope, palpitations, heartburn, abdominal pain, anorexia, nausea, vomiting, diarrhea  or change in bowel or urinary habits, change in stools or urine, dysuria,hematuria,  rash, arthralgias, visual complaints, headache, numbness weakness or ataxia or problems with walking or coordination,  change in mood/affect or memory.          .                Objective:   Physical Exam  amb wf mildly  Otherwise nad  Wt 156 08/06/2011  > 156 12/07/2011 > 12/27/2011  155>155 01/10/12 > 02/18/2012  158 > 157 03/24/2012  > 11/01/2012   149 > 150 11/20/2012 > 154  04/25/2013   HEENT: nl dentition, turbinates, and orophanx. Nl external ear canals without cough reflex   NECK :  without JVD/Nodes/TM/ nl carotid upstrokes bilaterally   LUNGS: no acc muscle use, CTA ,  Exp wheeze/ cough    CV:  RRR  no s3 or murmur or increase in P2, no edema   ABD:  soft and nontender with nl excursion in the supine position. No bruits or organomegaly, bowel sounds nl  MS:  warm without deformities, calf tenderness, cyanosis or clubbing      CXR  08/06/2011 : Slight flattening of diaphragm on lateral image may reflect minimal hyperinflation. There is  minimal central peribronchial thickening  Sinus CT 08/06/2011 Mild chronic sinusitis with mucosal edema in the maxillary sinus bilaterally.    CT chest 12/27/11 >Bilateral bronchiectasis is most evident in the left lower lobe  where ground-glass attenuation and scarring is evident. No  significant airspace consolidation is present.  2. Upper lobe bronchiectasis is slightly worse on the right. No  significant parenchymal disease is associated.  Assessment & Plan:

## 2013-04-25 NOTE — Patient Instructions (Addendum)
Continue duler 100 Take 2 puffs first thing in am and then another 2 puffs about 12 hours later.    Plan B = backup = ventolin Plan C = Neb albuterol  Augmentin 875 mg take one pill twice daily  X 10 days - take at breakfast and supper with large glass of water.  It would help reduce the usual side effects (diarrhea and yeast infections) if you ate cultured yogurt at lunch.   Prednisone 10 mg take  4 each am x 2 days,   2 each am x 2 days,  1 each am x 2 days and stop  Change to mucinex dm 1200 mg every 12 hours with flutter as much as possible then supplement with the tramadol up to 2 every 4 hours  Work on inhaler technique:  relax and gently blow all the way out then take a nice smooth deep breath back in, triggering the inhaler at same time you start breathing in.  Hold for up to 5 seconds if you can.  Rinse and gargle with water when done   If your mouth or throat starts to bother you,   I suggest you time the inhaler to your dental care and after using the inhaler(s) brush teeth and tongue with a baking soda containing toothpaste and when you rinse this out, gargle with it first to see if this helps your mouth and throat.    See Tammy NP w/in 2 weeks with all your medications, even over the counter meds, separated in two separate bags, the ones you take no matter what vs the ones you stop once you feel better and take only as needed when you feel you need them.   Tammy  will generate for you a new user friendly medication calendar that will put us all on the same page re: your medication use.     Without this process, it simply isn't possible to assure that we are providing  your outpatient care  with  the attention to detail we feel you deserve.   If we cannot assure that you're getting that kind of care,  then we cannot manage your problem effectively from this clinic.  Once you have seen Tammy and we are sure that we're all on the same page with your medication use she will arrange  follow up with me.  Late add: consider laba/ics neb if not maintaining adeq hfa technique

## 2013-05-14 ENCOUNTER — Encounter: Payer: Self-pay | Admitting: Adult Health

## 2013-05-14 ENCOUNTER — Ambulatory Visit (INDEPENDENT_AMBULATORY_CARE_PROVIDER_SITE_OTHER): Payer: Medicare Other | Admitting: Adult Health

## 2013-05-14 ENCOUNTER — Ambulatory Visit (INDEPENDENT_AMBULATORY_CARE_PROVIDER_SITE_OTHER)
Admission: RE | Admit: 2013-05-14 | Discharge: 2013-05-14 | Disposition: A | Discharge status: Home / Self Care | Payer: Medicare Other | Source: Ambulatory Visit | Attending: Adult Health | Admitting: Adult Health

## 2013-05-14 VITALS — BP 126/74 | HR 83 | Temp 97.8°F | Ht 62.0 in | Wt 152.0 lb

## 2013-05-14 DIAGNOSIS — J45909 Unspecified asthma, uncomplicated: Secondary | ICD-10-CM

## 2013-05-14 DIAGNOSIS — J479 Bronchiectasis, uncomplicated: Secondary | ICD-10-CM

## 2013-05-14 MED ORDER — AEROCHAMBER MV MISC: Status: DC

## 2013-05-14 MED ORDER — CEFDINIR 300 MG PO CAPS: 300.0000 mg | ORAL_CAPSULE | Freq: Two times a day (BID) | ORAL | Status: DC

## 2013-05-14 NOTE — Patient Instructions (Addendum)
Omnicef 300mg  Twice daily  For 10 days . Take w/ food.  Eat yogurt.  Start Probiotic -Align- daily for 2 weeks.  Follow med calendar for cough control .  Chest xray today .  Spacer given today  follow up Dr. Sherene SiresWert  In 6 weeks and As needed   Please contact office for sooner follow up if symptoms do not improve or worsen or seek emergency care

## 2013-05-14 NOTE — Progress Notes (Signed)
Subjective:    Patient ID: Caitlin Newman, female    DOB: September 22, 1945   MRN: [id number]  Brief patient profile:  67 yowf no sign smoking hx  With lots of wheezing as child outgrew at very young age (doesn't remember) then recurrent wheezing coughing worse in spring since around 1980 [facility] well controlled until Nov 2012 with refractory cough since then so referred 08/06/2011 to pulmonary clinic by PA Ninfa [place]athy Patterson from [place] with nl pfts 11/01/2012 including [id number]   08/06/2011 1st pulmonary eval/ Wert cc persistent cough x 6 months acute onset with "flu" in November now present daily  - had been on advair maint and ACEI but the latter was stopped early May 2013 no improvement cough which is worse p eat and afternoons and cough for an hour at bedtime with sense of something stuck upper mid chest and coughs to point of choking/vomiting>  Productive of min yellow mucus. rec Stop advair, tessilon - only use albuterol (ventolin) puffer as rescue Start Pulmicort twice daily with nebulized albuterol until cough completely gone for a couple of weeks then restart the advair  Schedule sinus ct > Mild chronic sinusitis with mucosal edema in the maxillary sinus  bilaterally. Try prilosec 20mg   Take 30-60 min before first meal of the day and Pepcid 20 mg one bedtime until cough is completely gone for at least a week without the need for cough suppression GERD diet Prednisone 10 mg take  4 each am x 2 days,   2 each am x 2 days,  1 each am x2days and stop     10/18/2011 f/u ov/Wert cc got some better but never completely then resumed advair and gradually worsening  Cough again  to the point of choking and vomiting despite rx with  ppi and h2hs- no sob unless coughing Sinus symptoms have been about avg and doesn't use anything for them.  Prednisone did not help previously. rec Stop advair, tessilon - only use albuterol (ventolin) puffer as rescue Start Pulmicort twice daily with nebulized  performist until return Cyclical cough rx  01/10/12 Follow up and med review    Patient returns for a two-week followup and medication review. We reviewed all her medications and organized them into a medication calendar with patient education. She complains that her cough is minimally improved Seen 2 weeks ago and started on dulera , singulair , chlortrimeton .  rec Add Delsym 2 tsp Twice daily  For cough  Add Tramadol 50mg  1-2 every 4hr as needed for breakthrough cough  Goal is NO COUGHING -NO THROAT CLEARING    02/18/2012 f/u ov/Wert cc much worse x one week with increase cough/ congestion and yellow mucus, best response was to prednisone, neb laba/ics and augmentin, still not able to use hfa effectively. Also sob with minimal activity. Using med calendar better x doesn't understand how to maximize prns  rec As per med calendar: Stop singulair and dulera Maximize mucinex dm to 2 every 12 hours as needed for cough and congestion Start performist and budesonide twice daily Prednisone 10 mg take  4 each am x 2 days,   2 each am x 2 days,  1 each am x2days and stop  Augmentin 875 twice daily x 10 days   03/24/2012 f/u ov/Wert using calendar, cc much better breathing/ cough on performist/ bud still needs ventolin 2 x weekly and still somewhat hoarse. rec No change, follow med calendar   11/01/2012 f/u ov/Wert no longer using med calendar,  off pulmicort x 10/16/12 just using performist due to cost Chief Complaint  Patient presents with  . Asthma    Breathing is doing well. Reports SOB. Denies coughing or chest tightness.  only wheezing with heavy exertion worse off ics with increase need for saba  >>d/c nebs, retry Dulera   11/20/2012 Follow up and Med review  Patient returns for a two-week medication review. Reviewed all her medications and organized them into a medication calendar with patient education. Appears that she is taking her medications correctly. Patient was changed over  to [facility] from Budesonide/Brovana Nebs last ov.   Says that she is doing well on Dulera. No flare in wheezing , cough or dyspnea.  She is fully retired now. And she has rare use of her Ventolin inhaler.   04/25/2013 f/u ov/Wert re: refractory cough maint on dulera 100 with poor hfa  Chief Complaint  Patient presents with  . Follow-up    Cough worse since end of Dec- prod with minimal yellow sputum.  She also c/o PND. Using ventolin approx bid.   did well 11/20/12 ov to x mas then caught cold, got neb alb and traveled to [place] and back with persistent hacking daytime cough and using the neb avg twice daily for relief of sob with more than adls. >>Augmentin and pred   05/14/2013 Follow up and Med review  Patient returns for a followup visit and medication review Unfortunately, patient did not bring her medications were reviewed today. We reviewed her medication list and updated it. We reviewed in her inhaler use. And suggested that she needs a spacer.  Patient says that she was feeling better. Shortly after her last visit. Patient had a bronchiectasis, exacerbation. It was treated with Augmentin and prednisone taper. Patient says that her symptoms did improve greatly. However, 3 days ago. She developed her cough with thick, yellow. Mucus. She is trying to use. Tramadol and Mucinex with some relief. She denies any hemoptysis, orthopnea, PND, or leg swelling.   Current Medications, Allergies, Complete Past Medical History, Past Surgical History, Family History, and Social History were reviewed in [employer] record.  ROS  The following are not active complaints unless bolded sore throat, dysphagia, dental problems, itching, sneezing,  nasal congestion or excess/ purulent secretions, ear ache,   fever, chills, sweats, unintended wt loss, pleuritic or exertional cp, hemoptysis,  orthopnea pnd or leg swelling, presyncope, palpitations, heartburn, abdominal pain, anorexia,  nausea, vomiting, diarrhea  or change in bowel or urinary habits, change in stools or urine, dysuria,hematuria,  rash, arthralgias, visual complaints, headache, numbness weakness or ataxia or problems with walking or coordination,  change in mood/affect or memory.         .                Objective:   Physical Exam  amb wf mildly  Otherwise nad  Wt 156 08/06/2011  > 156 12/07/2011 > 12/27/2011  155>155 01/10/12 > 02/18/2012  158 > 157 03/24/2012  > 11/01/2012   149 > 150 11/20/2012 > 154  04/25/2013 >152 05/14/2013   HEENT: nl dentition, turbinates, and orophanx. Nl external ear canals without cough reflex   NECK :  without JVD/Nodes/TM/ nl carotid upstrokes bilaterally   LUNGS: no acc muscle use, CTA ,  Scattered rhonchi   CV:  RRR  no s3 or murmur or increase in P2, no edema   ABD:  soft and nontender with nl excursion in the supine position. No bruits  or organomegaly, bowel sounds nl  MS:  warm without deformities, calf tenderness, cyanosis or clubbing      CXR  08/06/2011 : Slight flattening of diaphragm on lateral image may reflect minimal hyperinflation. There is minimal central peribronchial thickening  Sinus CT 08/06/2011 Mild chronic sinusitis with mucosal edema in the maxillary sinus bilaterally.    CT chest 12/27/11 >Bilateral bronchiectasis is most evident in the left lower lobe  where ground-glass attenuation and scarring is evident. No  significant airspace consolidation is present.  2. Upper lobe bronchiectasis is slightly worse on the right. No  significant parenchymal disease is associated.  Assessment & Plan:

## 2013-05-14 NOTE — Assessment & Plan Note (Signed)
Flare   Plan  Omnicef 300mg  Twice daily  For 10 days . Take w/ food.  Eat yogurt.  Start Probiotic -Align- daily for 2 weeks.  Follow med calendar for cough control .  Chest xray today .  Spacer given today  follow up Dr. Sherene SiresWert  In 6 weeks and As needed   Please contact office for sooner follow up if symptoms do not improve or worsen or seek emergency care

## 2013-05-16 ENCOUNTER — Telehealth: Payer: Self-pay

## 2013-05-16 NOTE — Telephone Encounter (Signed)
Message copied by Velvet BatheAULFIELD, Yuval Rubens L on Wed May 16, 2013  4:46 PM ------      Message from: Julio SicksPARRETT, TAMMY S      Created: Mon May 14, 2013  2:16 PM       No PNA noted       Cont w/ ov recs       Please contact office for sooner follow up if symptoms do not improve or worsen or seek emergency care        ------

## 2013-05-16 NOTE — Telephone Encounter (Signed)
Pt aware of cxr results, nothing else needed.

## 2013-06-25 ENCOUNTER — Encounter: Payer: Self-pay | Admitting: Internal Medicine

## 2013-06-25 ENCOUNTER — Ambulatory Visit (INDEPENDENT_AMBULATORY_CARE_PROVIDER_SITE_OTHER): Payer: Medicare Other | Admitting: Internal Medicine

## 2013-06-25 VITALS — BP 130/62 | HR 72 | Temp 98.0°F | Ht 62.0 in | Wt 148.0 lb

## 2013-06-25 DIAGNOSIS — Z23 Encounter for immunization: Secondary | ICD-10-CM

## 2013-06-25 DIAGNOSIS — J479 Bronchiectasis, uncomplicated: Secondary | ICD-10-CM

## 2013-06-25 NOTE — Progress Notes (Signed)
Subjective:    Patient ID: Caitlin Newman, female    DOB: Aug 30, 1945   MRN: [id number]  Brief patient profile:  67 yowf no sign smoking hx  With lots of wheezing as child outgrew at very young age (doesn't remember) then recurrent wheezing coughing worse in spring since around 1980 prev seen by [facility] well controlled until Nov 2012 with refractory cough since then so referred 08/06/2011 to pulmonary clinic by PA Ninfa Linden from [place] with nl pfts 11/01/2012 including [id number]    History of Present Illness  08/06/2011 1st pulmonary eval/ Canna Nickelson cc persistent cough x 6 months acute onset with "flu" in November now present daily  - had been on advair maint and ACEI but the latter was stopped early May 2013 no improvement cough which is worse p eat and afternoons and cough for an hour at bedtime with sense of something stuck upper mid chest and coughs to point of choking/vomiting>  Productive of min yellow mucus. rec Stop advair, tessilon - only use albuterol (ventolin) puffer as rescue Start Pulmicort twice daily with nebulized albuterol until cough completely gone for a couple of weeks then restart the advair  Schedule sinus ct > Mild chronic sinusitis with mucosal edema in the maxillary sinus  bilaterally. Try prilosec 20mg   Take 30-60 min before first meal of the day and Pepcid 20 mg one bedtime until cough is completely gone for at least a week without the need for cough suppression GERD diet Prednisone 10 mg take  4 each am x 2 days,   2 each am x 2 days,  1 each am x2days and stop     10/18/2011 f/u ov/Jamiya Nims cc got some better but never completely then resumed advair and gradually worsening  Cough again  to the point of choking and vomiting despite rx with  ppi and h2hs- no sob unless coughing Sinus symptoms have been about avg and doesn't use anything for them.  Prednisone did not help previously. rec Stop advair, tessilon - only use albuterol (ventolin) puffer as rescue Start Pulmicort  twice daily with nebulized performist until return Cyclical cough rx  01/10/12 Follow up and med review    Patient returns for a two-week followup and medication review. We reviewed all her medications and organized them into a medication calendar with patient education. She complains that her cough is minimally improved Seen 2 weeks ago and started on dulera , singulair , chlortrimeton .  rec Add Delsym 2 tsp Twice daily  For cough  Add Tramadol 50mg  1-2 every 4hr as needed for breakthrough cough  Goal is NO COUGHING -NO THROAT CLEARING    02/18/2012 f/u ov/Kiowa Hollar cc much worse x one week with increase cough/ congestion and yellow mucus, best response was to prednisone, neb laba/ics and augmentin, still not able to use hfa effectively. Also sob with minimal activity. Using med calendar better x doesn't understand how to maximize prns  rec As per med calendar: Stop singulair and dulera Maximize mucinex dm to 2 every 12 hours as needed for cough and congestion Start performist and budesonide twice daily Prednisone 10 mg take  4 each am x 2 days,   2 each am x 2 days,  1 each am x2days and stop  Augmentin 875 twice daily x 10 days   03/24/2012 f/u ov/Omesha Bowerman using calendar, cc much better breathing/ cough on performist/ bud still needs ventolin 2 x weekly and still somewhat hoarse. rec No change, follow med calendar   11/01/2012 f/u  ov/Tzion Wedel no longer using med calendar, off pulmicort x 10/16/12 just using performist due to cost Chief Complaint  Patient presents with  . Asthma    Breathing is doing well. Reports SOB. Denies coughing or chest tightness.  only wheezing with heavy exertion worse off ics with increase need for saba  >>d/c nebs, retry Dulera   11/20/2012 Follow up and Med review  Patient returns for a two-week medication review. Reviewed all her medications and organized them into a medication calendar with patient education. Appears that she is taking her medications  correctly. [facility]Brovana Nebs last ov.   Says that she is doing well on Dulera. No flare in wheezing , cough or dyspnea.  She is fully retired now. And she has rare use of her Ventolin inhaler.   04/25/2013 f/u ov/Afton Mikelson re: refractory cough maint on dulera 100 with poor hfa  Chief Complaint  Patient presents with  . Follow-up    Cough worse since end of Dec- prod with minimal yellow sputum.  She also c/o PND. Using ventolin approx bid.   did well 11/20/12 ov to x mas then caught cold, got neb alb and traveled to [place] and back with persistent hacking daytime cough and using the neb avg twice daily for relief of sob with more than adls. >>Augmentin and pred   05/14/2013 Follow up and Med review / NP Patient returns for a followup visit and medication review Unfortunately, patient did not bring her medications were reviewed today. We reviewed her medication list and updated it. We reviewed in her inhaler use. And suggested that she needs a spacer.  Patient says that she was feeling better. Shortly after her last visit. Patient had a bronchiectasis, exacerbation. It was treated with Augmentin and prednisone taper. Patient says that her symptoms did improve greatly. However, 3 days.prior to OV  . She developed her cough with thick, yellow. Mucus. She is trying to use. Tramadol and Mucinex with some relief. She denies any hemoptysis, orthopnea, PND, or leg swelling. rec Omnicef 300mg  Twice daily  For 10 days . Take w/ food.  Eat yogurt.  Start Probiotic -Align- daily for 2 weeks.  Follow med calendar for cough control .  Chest xray today .  Spacer given    06/25/2013 f/u ov/Marketa Midkiff re:  Bronchiectasis  Chief Complaint  Patient presents with  . Follow-up    Pt reports her cough and SOB have improved. No new co's today.  Not limited by breathing from desired activities  - saba 3 x weekly, no neb, walking 2 m daily, playing golf  Has "oyster" each am  and cough at bedtime non productive but overall much better   Current Medications, Allergies, Complete Past Medical History, Past Surgical History, Family History, and Social History were reviewed in [facility] electronic medical record.  ROS  The following are not active complaints unless bolded sore throat, dysphagia, dental problems, itching, sneezing,  nasal congestion or excess/ purulent secretions, ear ache,   fever, chills, sweats, unintended wt loss, pleuritic or exertional cp, hemoptysis,  orthopnea pnd or leg swelling, presyncope, palpitations, heartburn, abdominal pain, anorexia, nausea, vomiting, diarrhea  or change in bowel or urinary habits, change in stools or urine, dysuria,hematuria,  rash, arthralgias, visual complaints, headache, numbness weakness or ataxia or problems with walking or coordination,  change in mood/affect or memory.         .Marland Kitchen  Objective:   Physical Exam  amb wf mildly  nad  Wt 156 08/06/2011  > 156 12/07/2011 > 12/27/2011  155>155 01/10/12 > 02/18/2012  158 > 157 03/24/2012  > 11/01/2012   149 > 150 11/20/2012 > 154  04/25/2013 >152 05/14/2013 > 06/25/2013  148   HEENT: nl dentition, turbinates, and orophanx. Nl external ear canals without cough reflex   NECK :  without JVD/Nodes/TM/ nl carotid upstrokes bilaterally   LUNGS: no acc muscle use, CTA ,  Scattered mid exp bilateral rhonchi   CV:  RRR  no s3 or murmur or increase in P2, no edema   ABD:  soft and nontender with nl excursion in the supine position. No bruits or organomegaly, bowel sounds nl  MS:  warm without deformities, calf tenderness, cyanosis or clubbing      CXR  08/06/2011 : Slight flattening of diaphragm on lateral image may reflect minimal hyperinflation. There is minimal central peribronchial thickening  Sinus CT 08/06/2011 Mild chronic sinusitis with mucosal edema in the maxillary sinus bilaterally.    CT chest 12/27/11 >Bilateral bronchiectasis is most  evident in the left lower lobe  where ground-glass attenuation and scarring is evident. No  significant airspace consolidation is present.  2. Upper lobe bronchiectasis is slightly worse on the right. No  significant parenchymal disease is associated.  Assessment & Plan:

## 2013-06-25 NOTE — Patient Instructions (Addendum)
Prevar 13 today (last pneumonia shot you will need)  See calendar for specific medication instructions and bring it back for each and every office visit for every healthcare provider you see.  Without it,  you may not receive the best quality medical care that we feel you deserve.  You will note that the calendar groups together  your maintenance  medications that are timed at particular times of the day.  Think of this as your checklist for what your doctor has instructed you to do until your next evaluation to see what benefit  there is  to staying on a consistent group of medications intended to keep you well.  The other group at the bottom is entirely up to you to use as you see fit  for specific symptoms that may arise between visits that require you to treat them on an as needed basis.  Think of this as your action plan or "what if" list.   Separating the top medications from the bottom group is fundamental to providing you adequate care going forward.     Please schedule a follow up visit in 3 months but call sooner if needed with spacer/ inhalers in hand so we can confirm you are using them right

## 2013-06-27 NOTE — Assessment & Plan Note (Signed)
-    CT chest 01/10/12 1. Bilateral bronchiectasis is most evident in the left lower lobe where ground-glass attenuation and scarring is evident. No significant airspace consolidation is present. 2. Upper lobe bronchiectasis is slightly worse on the right. No significant parenchymal disease is associated. 3. Single calcified right paratracheal node may represent treated disease or a history granulomatous disease. Alpha one AT >  MM  02/2012  PFT"s wnl 11/01/2012  Prevar rx 06/25/13   Adequate control on present rx, reviewed > no change in rx needed      Each maintenance medication was reviewed in detail including most importantly the difference between maintenance and as needed and under what circumstances the prns are to be used. This was done in the context of a medication calendar review which provided the patient with a user-friendly unambiguous mechanism for medication administration and reconciliation and provides an action plan for all active problems. It is critical that this be shown to every doctor  for modification during the office visit if necessary so the patient can use it as a working document.

## 2013-08-01 ENCOUNTER — Other Ambulatory Visit: Payer: Self-pay | Admitting: Internal Medicine

## 2013-08-03 ENCOUNTER — Ambulatory Visit: Payer: Medicare Other | Admitting: Internal Medicine

## 2013-10-16 ENCOUNTER — Ambulatory Visit (INDEPENDENT_AMBULATORY_CARE_PROVIDER_SITE_OTHER): Payer: Medicare Other | Admitting: Internal Medicine

## 2013-10-16 ENCOUNTER — Encounter: Payer: Self-pay | Admitting: Internal Medicine

## 2013-10-16 VITALS — BP 130/74 | HR 72 | Temp 97.6°F | Ht 60.0 in | Wt 147.0 lb

## 2013-10-16 DIAGNOSIS — J471 Bronchiectasis with (acute) exacerbation: Secondary | ICD-10-CM

## 2013-10-16 DIAGNOSIS — J45909 Unspecified asthma, uncomplicated: Secondary | ICD-10-CM

## 2013-10-16 MED ORDER — PREDNISONE 10 MG PO TABS: ORAL_TABLET | ORAL | Status: DC

## 2013-10-16 MED ORDER — CEFDINIR 300 MG PO CAPS: 300.0000 mg | ORAL_CAPSULE | Freq: Two times a day (BID) | ORAL | Status: DC

## 2013-10-16 NOTE — Assessment & Plan Note (Signed)
-    CT chest 01/10/12 1. Bilateral bronchiectasis is most evident in the left lower lobe where ground-glass attenuation and scarring is evident. No significant airspace consolidation is present. 2. Upper lobe bronchiectasis is slightly worse on the right. No significant parenchymal disease is associated. 3. Single calcified right paratracheal node may represent treated disease or a history granulomatous disease. Alpha one AT >  MM  02/2012  PFT"s wnl 11/01/2012  Prevar rx 06/25/13   Mild flare, rx omnicef x 10 days

## 2013-10-16 NOTE — Patient Instructions (Addendum)
Omnicef 300mg  Twice daily  For 10 days  Prednisone 10 mg take  4 each am x 2 days,   2 each am x 2 days,  1 each am x 2 days and stop   Please schedule a follow up visit in 3 months but call sooner if needed to see Caitlin Newman for new med calendar

## 2013-10-16 NOTE — Assessment & Plan Note (Signed)
Mild flare > Prednisone 10 mg take  4 each am x 2 days,   2 each am x 2 days,  1 each am x 2 days and stop   The proper method of use, as well as anticipated side effects, of a metered-dose inhaler are discussed and demonstrated to the patient. Improved effectiveness after extensive coaching during this visit to a level of approximately  90% s spacer so leave off    Each maintenance medication was reviewed in detail including most importantly the difference between maintenance and as needed and under what circumstances the prns are to be used. This was done in the context of a medication calendar review which provided the patient with a user-friendly unambiguous mechanism for medication administration and reconciliation and provides an action plan for all active problems. It is critical that this be shown to every doctor  for modification during the office visit if necessary so the patient can use it as a working document.

## 2013-10-16 NOTE — Progress Notes (Signed)
Subjective:    Patient ID: Caitlin Newman, female    DOB: Feb 08, 1946   MRN: [id number]  Brief patient profile:  68 yowf no sign smoking hx  With lots of wheezing as child outgrew at very young age (doesn't remember) then recurrent wheezing coughing worse in spring since around 1980 prev seen by [facility] well controlled until Nov 2012 with refractory cough since then so referred 08/06/2011 to pulmonary clinic by PA Ninfa Linden from [place] with nl pfts 11/01/2012 including [id number]    History of Present Illness  08/06/2011 1st pulmonary eval/ Kada Friesen cc persistent cough x 6 months acute onset with "flu" in November now present daily  - had been on advair maint and ACEI but the latter was stopped early May 2013 no improvement cough which is worse p eat and afternoons and cough for an hour at bedtime with sense of something stuck upper mid chest and coughs to point of choking/vomiting>  Productive of min yellow mucus. rec Stop advair, tessilon - only use albuterol (ventolin) puffer as rescue Start Pulmicort twice daily with nebulized albuterol until cough completely gone for a couple of weeks then restart the advair  Schedule sinus ct > Mild chronic sinusitis with mucosal edema in the maxillary sinus  bilaterally. Try prilosec   Take 30-60 min before first meal of the day and Pepcid 20 mg one bedtime until cough is completely gone for at least a week without the need for cough suppression GERD diet Prednisone 10 mg take  4 each am x 2 days,   2 each am x 2 days,  1 each am x2days and stop     10/18/2011 f/u ov/Kerianne Gurr cc got some better but never completely then resumed advair and gradually worsening  Cough again  to the point of choking and vomiting despite rx with  ppi and h2hs- no sob unless coughing Sinus symptoms have been about avg and doesn't use anything for them.  Prednisone did not help previously. rec Stop advair, tessilon - only use albuterol (ventolin) puffer as rescue Start Pulmicort  twice daily with nebulized performist until return Cyclical cough rx  01/10/12 Follow up and med review    Patient returns for a two-week followup and medication review. We reviewed all her medications and organized them into a medication calendar with patient education. She complains that her cough is minimally improved Seen 2 weeks ago and started on dulera , singulair , chlortrimeton .  rec Add Delsym 2 tsp Twice daily  For cough  Add Tramadol  1-2 every 4hr as needed for breakthrough cough  Goal is NO COUGHING -NO THROAT CLEARING    02/18/2012 f/u ov/Bernice Mullin cc much worse x one week with increase cough/ congestion and yellow mucus, best response was to prednisone, neb laba/ics and augmentin, still not able to use hfa effectively. Also sob with minimal activity. Using med calendar better x doesn't understand how to maximize prns  rec As per med calendar: Stop singulair and dulera Maximize mucinex dm to 2 every 12 hours as needed for cough and congestion Start performist and budesonide twice daily Prednisone 10 mg take  4 each am x 2 days,   2 each am x 2 days,  1 each am x2days and stop  Augmentin 875 twice daily x 10 days   03/24/2012 f/u ov/Verina Galeno using calendar, cc much better breathing/ cough on performist/ bud still needs ventolin 2 x weekly and still somewhat hoarse. rec No change, follow med calendar   11/01/2012 f/u  ov/Khira Cudmore no longer using med calendar, off pulmicort x 10/16/12 just using performist due to cost Chief Complaint  Patient presents with  . Asthma    Breathing is doing well. Reports SOB. Denies coughing or chest tightness.  only wheezing with heavy exertion worse off ics with increase need for saba  >>d/c nebs, retry Dulera   11/20/2012 Follow up and Med review  Patient returns for a two-week medication review. Reviewed all her medications and organized them into a medication calendar with patient education. Appears that she is taking her medications  correctly. Patient was changed over to [facility] from Budesonide/Brovana Nebs last ov.   Says that she is doing well on Dulera. No flare in wheezing , cough or dyspnea.  She is fully retired now. And she has rare use of her Ventolin inhaler.   04/25/2013 f/u ov/Audrionna Lampton re: refractory cough maint on dulera 100 with poor hfa  Chief Complaint  Patient presents with  . Follow-up    Cough worse since end of Dec- prod with minimal yellow sputum.  She also c/o PND. Using ventolin approx bid.   did well 11/20/12 ov to x mas then caught cold, got neb alb and traveled to [place] and back with persistent hacking daytime cough and using the neb avg twice daily for relief of sob with more than adls. >>Augmentin and pred   05/14/2013 Follow up and Med review / NP Patient returns for a followup visit and medication review Unfortunately, patient did not bring her medications were reviewed today. We reviewed her medication list and updated it. We reviewed in her inhaler use. And suggested that she needs a spacer.  Patient says that she was feeling better. Shortly after her last visit. Patient had a bronchiectasis, exacerbation. It was treated with Augmentin and prednisone taper. Patient says that her symptoms did improve greatly. However, 3 days.prior to OV  . She developed her cough with thick, yellow. Mucus. She is trying to use. Tramadol and Mucinex with some relief. She denies any hemoptysis, orthopnea, PND, or leg swelling. rec Omnicef 300mg  Twice daily  For 10 days . Take w/ food.  Eat yogurt.  Start Probiotic -Align- daily for 2 weeks.  Follow med calendar for cough control .  Chest xray today .  Spacer given    06/25/2013 f/u ov/Taniaya Rudder re:  Bronchiectasis  Chief Complaint  Patient presents with  . Follow-up    Pt reports her cough and SOB have improved. No new co's today.  Not limited by breathing from desired activities  - saba 3 x weekly, no neb, walking 2 m daily, playing golf  Has "oyster" each am  and cough at bedtime non productive but overall much better  rec Prevar 13 today (last pneumonia shot you will need) See calendar for specific medication instructions     10/16/2013 f/u ov/Maclean Foister re: bronchiectasis Chief Complaint  Patient presents with  . Follow-up    Pt states that her cough is unchanged.   No new co's today. She is using albuterol inhaler and albuterol in her neb at least once per day.    doing ok until x 2 weeks prior to OV , indolent onset grad worse cough>   Mucus more yellow/ cough harsher / more doe so more saba, helps.   Current Medications, Allergies, Complete Past Medical History, Past Surgical History, Family History, and Social History were reviewed in [employer] record.  ROS  The following are not active complaints unless bolded sore throat,  dysphagia, dental problems, itching, sneezing,  nasal congestion or excess/ purulent secretions, ear ache,   fever, chills, sweats, unintended wt loss, pleuritic or exertional cp, hemoptysis,  orthopnea pnd or leg swelling, presyncope, palpitations, heartburn, abdominal pain, anorexia, nausea, vomiting, diarrhea  or change in bowel or urinary habits, change in stools or urine, dysuria,hematuria,  rash, arthralgias, visual complaints, headache, numbness weakness or ataxia or problems with walking or coordination,  change in mood/affect or memory.         .                Objective:   Physical Exam  amb wf mildly  nad  Wt 156 08/06/2011  > 156 12/07/2011 > 12/27/2011  155>155 01/10/12 > 02/18/2012  158 > 157 03/24/2012  > 11/01/2012   149 > 150 11/20/2012 > 154  04/25/2013 >152 05/14/2013 > 06/25/2013  148 > 10/16/2013  147   HEENT: nl dentition, turbinates, and orophanx. Nl external ear canals without cough reflex   NECK :  without JVD/Nodes/TM/ nl carotid upstrokes bilaterally   LUNGS: no acc muscle use, CTA ,  Prominent  mid exp bilateral rhonchi   CV:  RRR  no s3 or murmur or increase in P2,  no edema   ABD:  soft and nontender with nl excursion in the supine position. No bruits or organomegaly, bowel sounds nl  MS:  warm without deformities, calf tenderness, cyanosis or clubbing      cxr 05/14/13  Findings consistent with component of COPD but evidence of acute  cardiopulmonary disease .  Assessment & Plan:

## 2013-10-23 ENCOUNTER — Telehealth: Payer: Self-pay | Admitting: Internal Medicine

## 2013-10-23 ENCOUNTER — Ambulatory Visit (INDEPENDENT_AMBULATORY_CARE_PROVIDER_SITE_OTHER): Payer: Medicare Other | Admitting: Internal Medicine

## 2013-10-23 ENCOUNTER — Encounter (INDEPENDENT_AMBULATORY_CARE_PROVIDER_SITE_OTHER): Payer: Medicare Other | Admitting: Pulmonary Disease

## 2013-10-23 ENCOUNTER — Ambulatory Visit (INDEPENDENT_AMBULATORY_CARE_PROVIDER_SITE_OTHER)
Admission: RE | Admit: 2013-10-23 | Discharge: 2013-10-23 | Disposition: A | Discharge status: Home / Self Care | Payer: Medicare Other | Source: Ambulatory Visit | Attending: Internal Medicine | Admitting: Internal Medicine

## 2013-10-23 ENCOUNTER — Encounter: Payer: Self-pay | Admitting: Internal Medicine

## 2013-10-23 VITALS — BP 120/80 | HR 85 | Ht 60.0 in | Wt 149.2 lb

## 2013-10-23 DIAGNOSIS — J471 Bronchiectasis with (acute) exacerbation: Secondary | ICD-10-CM

## 2013-10-23 DIAGNOSIS — J45902 Unspecified asthma with status asthmaticus: Secondary | ICD-10-CM

## 2013-10-23 MED ORDER — PREDNISONE 10 MG PO TABS: ORAL_TABLET | ORAL | Status: DC

## 2013-10-23 MED ORDER — AMOXICILLIN-POT CLAVULANATE 875-125 MG PO TABS: 1.0000 | ORAL_TABLET | Freq: Two times a day (BID) | ORAL | Status: DC

## 2013-10-23 MED ORDER — FLUCONAZOLE 100 MG PO TABS: 100.0000 mg | ORAL_TABLET | Freq: Every day | ORAL | Status: DC

## 2013-10-23 NOTE — Telephone Encounter (Signed)
Called spoke w/ pt. appt scheduled to see [facility] today at 10:30. Nothing further needed

## 2013-10-23 NOTE — Patient Instructions (Addendum)
Augmentin 875 mg take one pill twice daily  X 10 days - take at breakfast and supper with large glass of water.  It would help reduce the usual side effects (diarrhea and yeast infections) if you ate cultured yogurt at lunch.   Take Prednsione 4 for three days 3 for three days 2 for three days 1 for three days and stop   Use the flutter valve as much as your can and if still coughing use tramadol as per the calendar   While coughing it is better to use spacer for your dulera   Please remember to go to the x-ray department downstairs for your tests - we will call you with the results when they are available.  Reset visit with Tammy np with all your medications and the calendar for 2 weeks from now

## 2013-10-23 NOTE — Progress Notes (Signed)
Subjective:    Patient ID: Caitlin Newman, female    DOB: Feb 08, 1946   MRN: [id number]  Brief patient profile:  68 yowf no sign smoking hx  With lots of wheezing as child outgrew at very young age (doesn't remember) then recurrent wheezing coughing worse in spring since around 1980 prev seen by [facility] well controlled until Nov 2012 with refractory cough since then so referred 08/06/2011 to pulmonary clinic by PA Ninfa Linden from [place] with nl pfts 11/01/2012 including [id number]    History of Present Illness  08/06/2011 1st pulmonary eval/ Caitlin Newman cc persistent cough x 6 months acute onset with "flu" in November now present daily  - had been on advair maint and ACEI but the latter was stopped early May 2013 no improvement cough which is worse p eat and afternoons and cough for an hour at bedtime with sense of something stuck upper mid chest and coughs to point of choking/vomiting>  Productive of min yellow mucus. rec Stop advair, tessilon - only use albuterol (ventolin) puffer as rescue Start Pulmicort twice daily with nebulized albuterol until cough completely gone for a couple of weeks then restart the advair  Schedule sinus ct > Mild chronic sinusitis with mucosal edema in the maxillary sinus  bilaterally. Try prilosec   Take 30-60 min before first meal of the day and Pepcid 20 mg one bedtime until cough is completely gone for at least a week without the need for cough suppression GERD diet Prednisone 10 mg take  4 each am x 2 days,   2 each am x 2 days,  1 each am x2days and stop     10/18/2011 f/u ov/Caitlin Newman cc got some better but never completely then resumed advair and gradually worsening  Cough again  to the point of choking and vomiting despite rx with  ppi and h2hs- no sob unless coughing Sinus symptoms have been about avg and doesn't use anything for them.  Prednisone did not help previously. rec Stop advair, tessilon - only use albuterol (ventolin) puffer as rescue Start Pulmicort  twice daily with nebulized performist until return Cyclical cough rx  01/10/12 Follow up and med review    Patient returns for a two-week followup and medication review. We reviewed all her medications and organized them into a medication calendar with patient education. She complains that her cough is minimally improved Seen 2 weeks ago and started on dulera , singulair , chlortrimeton .  rec Add Delsym 2 tsp Twice daily  For cough  Add Tramadol  1-2 every 4hr as needed for breakthrough cough  Goal is NO COUGHING -NO THROAT CLEARING    02/18/2012 f/u ov/Caitlin Newman cc much worse x one week with increase cough/ congestion and yellow mucus, best response was to prednisone, neb laba/ics and augmentin, still not able to use hfa effectively. Also sob with minimal activity. Using med calendar better x doesn't understand how to maximize prns  rec As per med calendar: Stop singulair and dulera Maximize mucinex dm to 2 every 12 hours as needed for cough and congestion Start performist and budesonide twice daily Prednisone 10 mg take  4 each am x 2 days,   2 each am x 2 days,  1 each am x2days and stop  Augmentin 875 twice daily x 10 days   03/24/2012 f/u ov/Caitlin Newman using calendar, cc much better breathing/ cough on performist/ bud still needs ventolin 2 x weekly and still somewhat hoarse. rec No change, follow med calendar   11/01/2012 f/u  ov/Caitlin Newman no longer using med calendar, off pulmicort x 10/16/12 just using performist due to cost Chief Complaint  Patient presents with  . Asthma    Breathing is doing well. Reports SOB. Denies coughing or chest tightness.  only wheezing with heavy exertion worse off ics with increase need for saba  >>d/c nebs, retry Dulera   11/20/2012 Follow up and Med review  Patient returns for a two-week medication review. Reviewed all her medications and organized them into a medication calendar with patient education. Appears that she is taking her medications  correctly. [facility] Budesonide/Brovana Nebs last ov.   Says that she is doing well on Dulera. No flare in wheezing , cough or dyspnea.  She is fully retired now. And she has rare use of her Ventolin inhaler.   04/25/2013 f/u ov/Caitlin Newman re: refractory cough maint on dulera 100 with poor hfa  Chief Complaint  Patient presents with  . Follow-up    Cough worse since end of Dec- prod with minimal yellow sputum.  She also c/o PND. Using ventolin approx bid.   did well 11/20/12 ov to x mas then caught cold, got neb alb and traveled to [place] and back with persistent hacking daytime cough and using the neb avg twice daily for relief of sob with more than adls. >>Augmentin and pred   05/14/2013 Follow up and Med review / NP Patient returns for a followup visit and medication review Unfortunately, patient did not bring her medications were reviewed today. We reviewed her medication list and updated it. We reviewed in her inhaler use. And suggested that she needs a spacer.  Patient says that she was feeling better. Shortly after her last visit. Patient had a bronchiectasis, exacerbation. It was treated with Augmentin and prednisone taper. Patient says that her symptoms did improve greatly. However, 3 days.prior to OV  . She developed her cough with thick, yellow. Mucus. She is trying to use. Tramadol and Mucinex with some relief. She denies any hemoptysis, orthopnea, PND, or leg swelling. rec Omnicef 300mg  Twice daily  For 10 days . Take w/ food.  Eat yogurt.  Start Probiotic -Align- daily for 2 weeks.  Follow med calendar for cough control .  Chest xray today .  Spacer given    06/25/2013 f/u ov/Caitlin Newman re:  Bronchiectasis  Chief Complaint  Patient presents with  . Follow-up    Pt reports her cough and SOB have improved. No new co's today.  Not limited by breathing from desired activities  - saba 3 x weekly, no neb, walking 2 m daily, playing golf  Has "oyster" each am  and cough at bedtime non productive but overall much better  rec Prevar 13 today (last pneumonia shot you will need) See calendar for specific medication instructions     10/16/2013 f/u ov/Jalena Vanderlinden re: bronchiectasis Chief Complaint  Patient presents with  . Follow-up    Pt states that her cough is unchanged.   No new co's today. She is using albuterol inhaler and albuterol in her neb at least once per day.    doing ok until x 2 weeks prior to OV , indolent onset grad worse cough>   Mucus more yellow/ cough harsher / more doe so more saba, helps rec Omnicef 300mg  Twice daily  For 10 days Prednisone 10 mg take  4 each am x 2 days,   2 each am x 2 days,  1 each am x 2 days and stop  10/23/2013 f/u ov/Kamaron Deskins re: acute exac bronchiectasis Chief Complaint  Patient presents with  . Acute Visit    Pt states that his cough is worse since the last visit- prod with large amounts of yellow sputum.    dulera 100 2bid / already used performist and ventolin on day of ov Better until 8/9 then severe flare, only used max of 2 tramadol per day and come to office s flutter with cough heard across the office   No obvious day to day or daytime variabilty or assoc  cp or chest tightness, subjective wheeze overt sinus or hb symptoms. No unusual exp hx or h/o childhood pna/ asthma or knowledge of premature birth.  Sleeping ok p tramadol. Also denies any obvious fluctuation of symptoms with weather or environmental changes or other aggravating or alleviating factors except as outlined above   Current Medications, Allergies, Complete Past Medical History, Past Surgical History, Family History, and Social History were reviewed in [facility] electronic medical record.  ROS  The following are not active complaints unless bolded sore throat, dysphagia, dental problems, itching, sneezing,  nasal congestion or excess/ purulent secretions, ear ache,   fever, chills, sweats, unintended wt loss, pleuritic or  exertional cp, hemoptysis,  orthopnea pnd or leg swelling, presyncope, palpitations, heartburn, abdominal pain, anorexia, nausea, vomiting, diarrhea  or change in bowel or urinary habits, change in stools or urine, dysuria,hematuria,  rash, arthralgias, visual complaints, headache, numbness weakness or ataxia or problems with walking or coordination,  change in mood/affect or memory.                 .                Objective:   Physical Exam  amb wf mildly  nad  Very harsh/ hacking exp cough   Wt 156 08/06/2011  > 156 12/07/2011 > 12/27/2011  155>155 01/10/12 > 02/18/2012  158 > 157 03/24/2012  > 11/01/2012   149 > 150 11/20/2012 > 154  04/25/2013 >152 05/14/2013 > 06/25/2013  148 > 10/16/2013  147 > 10/23/2013 149   HEENT: nl dentition, turbinates, and orophanx. Nl external ear canals without cough reflex   NECK :  without JVD/Nodes/TM/ nl carotid upstrokes bilaterally   LUNGS: no acc muscle use, CTA ,  Prominent  mid exp bilateral rhonchi   CV:  RRR  no s3 or murmur or increase in P2, no edema   ABD:  soft and nontender with nl excursion in the supine position. No bruits or organomegaly, bowel sounds nl  MS:  warm without deformities, calf tenderness, cyanosis or clubbing      CXR  10/23/2013 :  Stable cardiac and mediastinal contours. No consolidative pulmonary opacities. No pleural effusion or pneumothorax. Regional skeleton is unremarkable. Old left rib fracture.   .  Assessment & Plan:

## 2013-10-24 NOTE — Progress Notes (Signed)
Quick Note:  Spoke with pt and notified of results per Dr. Wert. Pt verbalized understanding and denied any questions.  ______ 

## 2013-10-24 NOTE — Assessment & Plan Note (Signed)
-    CT chest 01/10/12 1. Bilateral bronchiectasis is most evident in the left lower lobe where ground-glass attenuation and scarring is evident. No significant airspace consolidation is present. 2. Upper lobe bronchiectasis is slightly worse on the right. No significant parenchymal disease is associated. 3. Single calcified right paratracheal node may represent treated disease or a history granulomatous disease. Alpha one AT >  MM  02/2012  PFT"s wnl 11/01/2012  Prevar rx 06/25/13   Apparent flare ? Mechanism with excess cough/ not using flutter valve or tramadol as per med calendar  recommended 10 d course of augmentin   Each maintenance medication was reviewed in detail including most importantly the difference between maintenance and as needed and under what circumstances the prns are to be used. This was done in the context of a medication calendar review which provided the patient with a user-friendly unambiguous mechanism for medication administration and reconciliation and provides an action plan for all active problems. It is critical that this be shown to every doctor  for modification during the office visit if necessary so the patient can use it as a working document.

## 2013-10-24 NOTE — Assessment & Plan Note (Addendum)
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 - hfa 75% p coaching 11/01/2012 and can't afford pulmocort neb > rechallenged with dulera 100 2bid  -Med calendar 11/20/2012  - 10/16/2013 p extensive coaching HFA effectiveness =    90% s spacer  DDX of  difficult airways management all start with A and  include Adherence, Ace Inhibitors, Acid Reflux, Active Sinus Disease, Alpha 1 Antitripsin deficiency, Anxiety masquerading as Airways dz,  ABPA,  allergy(esp in young), Aspiration (esp in elderly), Adverse effects of DPI,  Active smokers, plus two Bs  = Bronchiectasis and Beta blocker use..and one C= CHF  Adherence is always the initial "prime suspect" and is a multilayered concern that requires a "trust but verify" approach in every patient - starting with knowing how to use medications, especially inhalers, correctly, keeping up with refills and understanding the fundamental difference between maintenance and prns vs those medications only taken for a very short course and then stopped and not refilled.   ? Acid (or non-acid) GERD > always difficult to exclude as up to 75% of pts in some series report no assoc GI/ Heartburn symptoms> rec max (24h)  acid suppression and diet restrictions/ reviewed and instructions given in writing.   ? Allergy > Prednisone 10 mg take  4 each am x 2 days,   2 each am x 2 days,  1 each am x 2 days and stop   ? active sinusitis / bronchiectasis flare > augmentin x 10 da and encouraged flutter use.  Plan to regroup in 2 weeks with all meds in hand  See instructions for specific recommendations which were reviewed directly with the patient who was given a copy with highlighter outlining the key components.

## 2013-10-30 ENCOUNTER — Encounter: Payer: Medicare Other | Admitting: Adult Health

## 2013-11-13 ENCOUNTER — Other Ambulatory Visit: Payer: Medicare Other

## 2013-11-13 ENCOUNTER — Ambulatory Visit (INDEPENDENT_AMBULATORY_CARE_PROVIDER_SITE_OTHER): Payer: Medicare Other | Admitting: Adult Health

## 2013-11-13 ENCOUNTER — Encounter: Payer: Self-pay | Admitting: Adult Health

## 2013-11-13 VITALS — BP 116/70 | HR 95 | Temp 98.2°F | Ht 60.0 in | Wt 150.6 lb

## 2013-11-13 DIAGNOSIS — J471 Bronchiectasis with (acute) exacerbation: Secondary | ICD-10-CM

## 2013-11-13 MED ORDER — LEVALBUTEROL HCL 0.63 MG/3ML IN NEBU
0.6300 mg | INHALATION_SOLUTION | Freq: Once | RESPIRATORY_TRACT | Status: AC
  Administered 2013-11-13: 0.63 mg via RESPIRATORY_TRACT

## 2013-11-13 MED ORDER — ALBUTEROL SULFATE (2.5 MG/3ML) 0.083% IN NEBU: 2.5000 mg | INHALATION_SOLUTION | RESPIRATORY_TRACT | Status: DC | PRN

## 2013-11-13 NOTE — Assessment & Plan Note (Addendum)
Recurrent exacerbation -hold on additional abx for now  Check sputum culture  Patient's medications were reviewed today and patient education was given. Computerized medication calendar was adjusted/completed  If not improving will need to consider CT sinus/chest  xopenex neb x 1   Plan  Sputum culture today .  Follow med calendar for cough control .  Follow up Dr. Sherene Sires  In 4 weeks and As needed   Please contact office for sooner follow up if symptoms do not improve or worsen or seek emergency care

## 2013-11-13 NOTE — Patient Instructions (Signed)
Sputum culture today .  Follow med calendar for cough control .  Follow up Dr. Sherene Sires  In 4 weeks and As needed   Please contact office for sooner follow up if symptoms do not improve or worsen or seek emergency care

## 2013-11-13 NOTE — Addendum Note (Signed)
Addended by: JONES, JESSICA E on: 11/13/2013 03:18 PM   Modules accepted: Orders  

## 2013-11-13 NOTE — Progress Notes (Signed)
Chart and ov reviewed and agree with a/p 

## 2013-11-13 NOTE — Progress Notes (Signed)
Subjective:    Patient ID: Caitlin Newman, female    DOB: Feb 08, 1946   MRN: [id number]  Brief patient profile:  68 yowf no sign smoking hx  With lots of wheezing as child outgrew at very young age (doesn't remember) then recurrent wheezing coughing worse in spring since around 1980 prev seen by [facility] well controlled until Nov 2012 with refractory cough since then so referred 08/06/2011 to pulmonary clinic by PA Ninfa Linden from [place] with nl pfts 11/01/2012 including [id number]    History of Present Illness  08/06/2011 1st pulmonary eval/ Wert cc persistent cough x 6 months acute onset with "flu" in November now present daily  - had been on advair maint and ACEI but the latter was stopped early May 2013 no improvement cough which is worse p eat and afternoons and cough for an hour at bedtime with sense of something stuck upper mid chest and coughs to point of choking/vomiting>  Productive of min yellow mucus. rec Stop advair, tessilon - only use albuterol (ventolin) puffer as rescue Start Pulmicort twice daily with nebulized albuterol until cough completely gone for a couple of weeks then restart the advair  Schedule sinus ct > Mild chronic sinusitis with mucosal edema in the maxillary sinus  bilaterally. Try prilosec   Take 30-60 min before first meal of the day and Pepcid 20 mg one bedtime until cough is completely gone for at least a week without the need for cough suppression GERD diet Prednisone 10 mg take  4 each am x 2 days,   2 each am x 2 days,  1 each am x2days and stop     10/18/2011 f/u ov/Wert cc got some better but never completely then resumed advair and gradually worsening  Cough again  to the point of choking and vomiting despite rx with  ppi and h2hs- no sob unless coughing Sinus symptoms have been about avg and doesn't use anything for them.  Prednisone did not help previously. rec Stop advair, tessilon - only use albuterol (ventolin) puffer as rescue Start Pulmicort  twice daily with nebulized performist until return Cyclical cough rx  01/10/12 Follow up and med review    Patient returns for a two-week followup and medication review. We reviewed all her medications and organized them into a medication calendar with patient education. She complains that her cough is minimally improved Seen 2 weeks ago and started on dulera , singulair , chlortrimeton .  rec Add Delsym 2 tsp Twice daily  For cough  Add Tramadol  1-2 every 4hr as needed for breakthrough cough  Goal is NO COUGHING -NO THROAT CLEARING    02/18/2012 f/u ov/Wert cc much worse x one week with increase cough/ congestion and yellow mucus, best response was to prednisone, neb laba/ics and augmentin, still not able to use hfa effectively. Also sob with minimal activity. Using med calendar better x doesn't understand how to maximize prns  rec As per med calendar: Stop singulair and dulera Maximize mucinex dm to 2 every 12 hours as needed for cough and congestion Start performist and budesonide twice daily Prednisone 10 mg take  4 each am x 2 days,   2 each am x 2 days,  1 each am x2days and stop  Augmentin 875 twice daily x 10 days   03/24/2012 f/u ov/Wert using calendar, cc much better breathing/ cough on performist/ bud still needs ventolin 2 x weekly and still somewhat hoarse. rec No change, follow med calendar   11/01/2012 f/u  ov/Wert no longer using med calendar, off pulmicort x 10/16/12 just using performist due to cost Chief Complaint  Patient presents with  . Asthma    Breathing is doing well. Reports SOB. Denies coughing or chest tightness.  only wheezing with heavy exertion worse off ics with increase need for saba  >>d/c nebs, retry Dulera   11/20/2012 Follow up and Med review  Patient returns for a two-week medication review. Reviewed all her medications and organized them into a medication calendar with patient education. Appears that she is taking her medications  correctly. Patient was [facility] from Budesonide/Brovana Nebs last ov.   Says that she is doing well on Dulera. No flare in wheezing , cough or dyspnea.  She is fully retired now. And she has rare use of her Ventolin inhaler.   04/25/2013 f/u ov/Wert re: refractory cough maint on dulera 100 with poor hfa  Chief Complaint  Patient presents with  . Follow-up    Cough worse since end of Dec- prod with minimal yellow sputum.  She also c/o PND. Using ventolin approx bid.   did well 11/20/12 ov to x mas then caught cold, got neb alb and traveled to [place] and back with persistent hacking daytime cough and using the neb avg twice daily for relief of sob with more than adls. >>Augmentin and pred   05/14/2013 Follow up and Med review / NP Patient returns for a followup visit and medication review Unfortunately, patient did not bring her medications were reviewed today. We reviewed her medication list and updated it. We reviewed in her inhaler use. And suggested that she needs a spacer.  Patient says that she was feeling better. Shortly after her last visit. Patient had a bronchiectasis, exacerbation. It was treated with Augmentin and prednisone taper. Patient says that her symptoms did improve greatly. However, 3 days.prior to OV  . She developed her cough with thick, yellow. Mucus. She is trying to use. Tramadol and Mucinex with some relief. She denies any hemoptysis, orthopnea, PND, or leg swelling. rec Omnicef 300mg  Twice daily  For 10 days . Take w/ food.  Eat yogurt.  Start Probiotic -Align- daily for 2 weeks.  Follow med calendar for cough control .  Chest xray today .  Spacer given    06/25/2013 f/u ov/Wert re:  Bronchiectasis  Chief Complaint  Patient presents with  . Follow-up    Pt reports her cough and SOB have improved. No new co's today.  Not limited by breathing from desired activities  - saba 3 x weekly, no neb, walking 2 m daily, playing golf  Has "oyster" each am  and cough at bedtime non productive but overall much better  rec Prevar 13 today (last pneumonia shot you will need) See calendar for specific medication instructions     10/16/2013 f/u ov/Wert re: bronchiectasis Chief Complaint  Patient presents with  . Follow-up    Pt states that her cough is unchanged.   No new co's today. She is using albuterol inhaler and albuterol in her neb at least once per day.    doing ok until x 2 weeks prior to OV , indolent onset grad worse cough>   Mucus more yellow/ cough harsher / more doe so more saba, helps rec Omnicef 300mg  Twice daily  For 10 days Prednisone 10 mg take  4 each am x 2 days,   2 each am x 2 days,  1 each am x 2 days and stop  10/23/2013 f/u ov/Wert re: acute exac bronchiectasis Chief Complaint  Patient presents with  . Acute Visit    Pt states that his cough is worse since the last visit- prod with large amounts of yellow sputum.    dulera 100 2bid / already used performist and ventolin on day of ov Better until 8/9 then severe flare, only used max of 2 tramadol per day and come to office s flutter with cough heard across the office  >augmentin /[pred rx   11/13/2013 Follow up and Med review  Patient returns for a followup and medication review. We reviewed all her medications and organized them into a medication calendar with patient education. Appears that she is taking her medications >. Last visit. Patient with a bronchiectatic exacerbation treated with Augmentin x10 days along with a prednisone taper. Patient says that she was feeling better but now cough /congestion are coming back. Has  prod cough with yellow mucus, wheezing, increased SOB, sore throat, tightness in chest, head congestion w/ PND and hoarseness x1-2 weeks.  Denies fever, hemoptysis, n/v/d. Cough is violent at times.  We discussed cough control regimen.  Has been on 2 10 day courses of abx in last 4 weeks.    Current Medications, Allergies, Complete Past Medical  History, Past Surgical History, Family History, and Social History were reviewed in [employer] record.  ROS  The following are not active complaints unless bolded sore throat, dysphagia, dental problems, itching, sneezing,  nasal congestion or excess/ purulent secretions, ear ache,   fever, chills, sweats, unintended wt loss, pleuritic or exertional cp, hemoptysis,  orthopnea pnd or leg swelling, presyncope, palpitations, heartburn, abdominal pain, anorexia, nausea, vomiting, diarrhea  or change in bowel or urinary habits, change in stools or urine, dysuria,hematuria,  rash, arthralgias, visual complaints, headache, numbness weakness or ataxia or problems with walking or coordination,  change in mood/affect or memory.                Objective:   Physical Exam  amb wf mildly  nad  Very harsh/ hacking exp cough   Wt 156 08/06/2011  > 156 12/07/2011 > 12/27/2011  155>155 01/10/12 > 02/18/2012  158 > 157 03/24/2012  > 11/01/2012   149 > 150 11/20/2012 > 154  04/25/2013 >152 05/14/2013 > 06/25/2013  148 > 10/16/2013  147 > 10/23/2013 149>150 11/13/2013   HEENT: nl dentition, turbinates, and orophanx. Nl external ear canals without cough reflex, mild turbinate edema.  Post pharynx clear, no exudate/thrush    NECK :  without JVD/Nodes/TM/ nl carotid upstrokes bilaterally   LUNGS: no acc muscle use, CTA ,  Prominent   bilateral rhonchi   CV:  RRR  no s3 or murmur or increase in P2, no edema   ABD:  soft and nontender with nl excursion in the supine position. No bruits or organomegaly, bowel sounds nl  MS:  warm without deformities, calf tenderness, cyanosis or clubbing      CXR  10/23/2013 :  Stable cardiac and mediastinal contours. No consolidative pulmonary opacities. No pleural effusion or pneumothorax. Regional skeleton is unremarkable. Old left rib fracture.   .  Assessment & Plan:

## 2013-11-13 NOTE — Addendum Note (Signed)
Addended by: Boone Master E on: 11/13/2013 10:16 AM   Modules accepted: Orders

## 2013-11-16 LAB — RESPIRATORY CULTURE OR RESPIRATORY AND SPUTUM CULTURE: ORGANISM ID, BACTERIA: NORMAL

## 2013-11-20 ENCOUNTER — Other Ambulatory Visit: Payer: Self-pay | Admitting: Internal Medicine

## 2013-11-20 ENCOUNTER — Telehealth: Payer: Self-pay | Admitting: Internal Medicine

## 2013-11-20 DIAGNOSIS — R059 Cough, unspecified: Secondary | ICD-10-CM

## 2013-11-20 DIAGNOSIS — J471 Bronchiectasis with (acute) exacerbation: Secondary | ICD-10-CM

## 2013-11-20 DIAGNOSIS — R05 Cough: Secondary | ICD-10-CM

## 2013-11-20 DIAGNOSIS — J479 Bronchiectasis, uncomplicated: Secondary | ICD-10-CM

## 2013-11-20 NOTE — Telephone Encounter (Signed)
Pt calling b/c she was concerned a/b not hearing back form nurse, explained that nurse was wanting to hear from dr, and she understood, but says please call by the end of the day.Caren Griffins

## 2013-11-20 NOTE — Telephone Encounter (Signed)
Notes Recorded by Julio Sicks, NP on 11/16/2013 at 5:21 PM Sputum cx did not isolate a particular germ/org.  AFB is pending  If not any better needs CT chest and sinus w/ ov after with Dr. Sherene Sires  Please contact office for sooner follow up if symptoms do not improve or worsen or seek emergency care  Called pt with results 11/16/2013 -[id number]   I spoke with patient about results and she verbalized understanding and had no questions Called spoke with pt. She reports she was very sick over the weekend but better today. Pt reports she was running a fever, was coughing all night long, vomiting d/t cough, sore throat, hoarseness. Please advise MW thanks  Allergies  Allergen Reactions  . Avelox [Moxifloxacin Hcl In Nacl]   . Sulfa Antibiotics

## 2013-11-20 NOTE — Telephone Encounter (Signed)
Pt aware of recs. Order placed. Please advise PCC's thanks

## 2013-11-20 NOTE — Telephone Encounter (Signed)
Sorry to hear not better rec go ahead with CTa chest and CT sinus asap Back up Cough med was tramadol 50 1-2 every 4 h if needed - make sure she has it and using it prn in meantime

## 2013-11-21 ENCOUNTER — Ambulatory Visit (INDEPENDENT_AMBULATORY_CARE_PROVIDER_SITE_OTHER)
Admission: RE | Admit: 2013-11-21 | Discharge: 2013-11-21 | Disposition: A | Discharge status: Home / Self Care | Payer: Medicare Other | Source: Ambulatory Visit | Attending: Internal Medicine | Admitting: Internal Medicine

## 2013-11-21 ENCOUNTER — Other Ambulatory Visit (INDEPENDENT_AMBULATORY_CARE_PROVIDER_SITE_OTHER): Payer: Medicare Other

## 2013-11-21 ENCOUNTER — Encounter: Payer: Self-pay | Admitting: Internal Medicine

## 2013-11-21 ENCOUNTER — Ambulatory Visit
Admission: RE | Admit: 2013-11-21 | Discharge: 2013-11-21 | Disposition: A | Discharge status: Home / Self Care | Payer: Medicare Other | Source: Ambulatory Visit | Attending: Internal Medicine | Admitting: Internal Medicine

## 2013-11-21 DIAGNOSIS — J471 Bronchiectasis with (acute) exacerbation: Secondary | ICD-10-CM

## 2013-11-21 DIAGNOSIS — R059 Cough, unspecified: Secondary | ICD-10-CM

## 2013-11-21 DIAGNOSIS — J479 Bronchiectasis, uncomplicated: Secondary | ICD-10-CM

## 2013-11-21 DIAGNOSIS — R05 Cough: Secondary | ICD-10-CM

## 2013-11-21 LAB — BASIC METABOLIC PANEL
BUN: 14 mg/dL (ref 6–23)
CALCIUM: 9.3 mg/dL (ref 8.4–10.5)
CO2: 26 meq/L (ref 19–32)
Chloride: 105 mEq/L (ref 96–112)
Creatinine, Ser: 1.1 mg/dL (ref 0.4–1.2)
GFR: 55.71 mL/min — ABNORMAL LOW (ref >60.00)
Glucose, Bld: 89 mg/dL (ref 70–99)
Potassium: 3.8 mEq/L (ref 3.5–5.1)
Sodium: 139 mEq/L (ref 135–145)

## 2013-11-21 MED ORDER — IOHEXOL 350 MG/ML SOLN
80.0000 mL | Freq: Once | INTRAVENOUS | Status: AC | PRN
  Administered 2013-11-21: 80 mL via INTRAVENOUS

## 2013-11-21 NOTE — Progress Notes (Signed)
Quick Note:  Spoke with pt and notified of results per Dr. Wert. Pt verbalized understanding and denied any questions.  ______ 

## 2013-11-27 ENCOUNTER — Other Ambulatory Visit: Payer: Self-pay | Admitting: Internal Medicine

## 2013-11-27 NOTE — Telephone Encounter (Signed)
Pt already had both scans and results reported to patient on 11-21-13.

## 2013-12-11 ENCOUNTER — Ambulatory Visit: Payer: Medicare Other | Admitting: Internal Medicine

## 2013-12-13 ENCOUNTER — Encounter: Payer: Self-pay | Admitting: Internal Medicine

## 2013-12-13 ENCOUNTER — Ambulatory Visit (INDEPENDENT_AMBULATORY_CARE_PROVIDER_SITE_OTHER): Payer: Medicare Other | Admitting: Internal Medicine

## 2013-12-13 VITALS — BP 124/80 | HR 75 | Temp 97.8°F | Ht 61.0 in | Wt 147.0 lb

## 2013-12-13 DIAGNOSIS — J4540 Moderate persistent asthma, uncomplicated: Secondary | ICD-10-CM

## 2013-12-13 DIAGNOSIS — R05 Cough: Secondary | ICD-10-CM

## 2013-12-13 DIAGNOSIS — J479 Bronchiectasis, uncomplicated: Secondary | ICD-10-CM

## 2013-12-13 DIAGNOSIS — R059 Cough, unspecified: Secondary | ICD-10-CM

## 2013-12-13 DIAGNOSIS — Z23 Encounter for immunization: Secondary | ICD-10-CM

## 2013-12-13 NOTE — Assessment & Plan Note (Signed)
-   Sinus CT 11/21/2013 > Mild mucosal thickening inferior right maxillary sinus.  Reviewed optimal rx with flutter.    Each maintenance medication was reviewed in detail including most importantly the difference between maintenance and as needed and under what circumstances the prns are to be used. This was done in the context of a medication calendar review which provided the patient with a user-friendly unambiguous mechanism for medication administration and reconciliation and provides an action plan for all active problems. It is critical that this be shown to every doctor  for modification during the office visit if necessary so the patient can use it as a working document.

## 2013-12-13 NOTE — Assessment & Plan Note (Signed)
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

## 2013-12-13 NOTE — Assessment & Plan Note (Signed)
-    CT chest 01/10/12 1. Bilateral bronchiectasis is most evident in the left lower lobe where ground-glass attenuation and scarring is evident. No significant airspace consolidation is present. 2. Upper lobe bronchiectasis is slightly worse on the right. No significant parenchymal disease is associated. 3. Single calcified right paratracheal node may represent treated disease or a history granulomatous disease. Alpha one AT >  MM  02/2012  PFT"s wnl 11/01/2012  Prevar rx 06/25/13  CT 11/21/13 > no acute changes   The proper method of use, as well as anticipated side effects, of a metered-dose inhaler are discussed and demonstrated to the patient. Improved effectiveness after extensive coaching during this visit to a level of approximately  90%  Using saba for cough control despite using dulera 100 2bid very well which is ok as long as not overusing with risk tachyphylaxis

## 2013-12-13 NOTE — Progress Notes (Signed)
Subjective:    Patient ID: Caitlin Newman, female    DOB: Feb 08, 1946   MRN: [id number]  Brief patient profile:  68 yowf no sign smoking hx  With lots of wheezing as child outgrew at very young age (doesn't remember) then recurrent wheezing coughing worse in spring since around 1980 prev seen by [facility] well controlled until Nov 2012 with refractory cough since then so referred 08/06/2011 to pulmonary clinic by PA Ninfa Linden from [place] with nl pfts 11/01/2012 including [id number]    History of Present Illness  08/06/2011 1st pulmonary eval/ Caitlin Newman cc persistent cough x 6 months acute onset with "flu" in November now present daily  - had been on advair maint and ACEI but the latter was stopped early May 2013 no improvement cough which is worse p eat and afternoons and cough for an hour at bedtime with sense of something stuck upper mid chest and coughs to point of choking/vomiting>  Productive of min yellow mucus. rec Stop advair, tessilon - only use albuterol (ventolin) puffer as rescue Start Pulmicort twice daily with nebulized albuterol until cough completely gone for a couple of weeks then restart the advair  Schedule sinus ct > Mild chronic sinusitis with mucosal edema in the maxillary sinus  bilaterally. Try prilosec   Take 30-60 min before first meal of the day and Pepcid 20 mg one bedtime until cough is completely gone for at least a week without the need for cough suppression GERD diet Prednisone 10 mg take  4 each am x 2 days,   2 each am x 2 days,  1 each am x2days and stop     10/18/2011 f/u ov/Caitlin Newman cc got some better but never completely then resumed advair and gradually worsening  Cough again  to the point of choking and vomiting despite rx with  ppi and h2hs- no sob unless coughing Sinus symptoms have been about avg and doesn't use anything for them.  Prednisone did not help previously. rec Stop advair, tessilon - only use albuterol (ventolin) puffer as rescue Start Pulmicort  twice daily with nebulized performist until return Cyclical cough rx  01/10/12 Follow up and med review    Patient returns for a two-week followup and medication review. We reviewed all her medications and organized them into a medication calendar with patient education. She complains that her cough is minimally improved Seen 2 weeks ago and started on dulera , singulair , chlortrimeton .  rec Add Delsym 2 tsp Twice daily  For cough  Add Tramadol  1-2 every 4hr as needed for breakthrough cough  Goal is NO COUGHING -NO THROAT CLEARING    02/18/2012 f/u ov/Caitlin Newman cc much worse x one week with increase cough/ congestion and yellow mucus, best response was to prednisone, neb laba/ics and augmentin, still not able to use hfa effectively. Also sob with minimal activity. Using med calendar better x doesn't understand how to maximize prns  rec As per med calendar: Stop singulair and dulera Maximize mucinex dm to 2 every 12 hours as needed for cough and congestion Start performist and budesonide twice daily Prednisone 10 mg take  4 each am x 2 days,   2 each am x 2 days,  1 each am x2days and stop  Augmentin 875 twice daily x 10 days   03/24/2012 f/u ov/Caitlin Newman using calendar, cc much better breathing/ cough on performist/ bud still needs ventolin 2 x weekly and still somewhat hoarse. rec No change, follow med calendar   11/01/2012 f/u  ov/Caitlin Newman no longer using med calendar, off pulmicort x 10/16/12 just using performist due to cost Chief Complaint  Patient presents with  . Asthma    Breathing is doing well. Reports SOB. Denies coughing or chest tightness.  only wheezing with heavy exertion worse off ics with increase need for saba  >>d/c nebs, retry Dulera   11/20/2012 Follow up and Med review  Patient returns for a two-week medication review. Reviewed all her medications and organized them into a medication calendar with patient education. Appears that she is taking her medications  correctly. [facility] Budesonide/Brovana Nebs last ov.   Says that she is doing well on Dulera. No flare in wheezing , cough or dyspnea.  She is fully retired now. And she has rare use of her Ventolin inhaler.   04/25/2013 f/u ov/Caitlin Newman re: refractory cough maint on dulera 100 with poor hfa  Chief Complaint  Patient presents with  . Follow-up    Cough worse since end of Dec- prod with minimal yellow sputum.  She also c/o PND. Using ventolin approx bid.   did well 11/20/12 ov to x mas then caught cold, got neb alb and traveled to [place] and back with persistent hacking daytime cough and using the neb avg twice daily for relief of sob with more than adls. >>Augmentin and pred   05/14/2013 Follow up and Med review / NP Patient returns for a followup visit and medication review Unfortunately, patient did not bring her medications were reviewed today. We reviewed her medication list and updated it. We reviewed in her inhaler use. And suggested that she needs a spacer.  Patient says that she was feeling better. Shortly after her last visit. Patient had a bronchiectasis, exacerbation. It was treated with Augmentin and prednisone taper. Patient says that her symptoms did improve greatly. However, 3 days.prior to OV  . She developed her cough with thick, yellow. Mucus. She is trying to use. Tramadol and Mucinex with some relief. She denies any hemoptysis, orthopnea, PND, or leg swelling. rec Omnicef 300mg  Twice daily  For 10 days . Take w/ food.  Eat yogurt.  Start Probiotic -Align- daily for 2 weeks.  Follow med calendar for cough control .  Chest xray today .  Spacer given    06/25/2013 f/u ov/Caitlin Newman re:  Bronchiectasis  Chief Complaint  Patient presents with  . Follow-up    Pt reports her cough and SOB have improved. No new co's today.  Not limited by breathing from desired activities  - saba 3 x weekly, no neb, walking 2 m daily, playing golf  Has "oyster" each am  and cough at bedtime non productive but overall much better  rec Prevar 13 today (last pneumonia shot you will need) See calendar for specific medication instructions     10/16/2013 f/u ov/Goodwin Kamphaus re: bronchiectasis Chief Complaint  Patient presents with  . Follow-up    Pt states that her cough is unchanged.   No new co's today. She is using albuterol inhaler and albuterol in her neb at least once per day.    doing ok until x 2 weeks prior to OV , indolent onset grad worse cough>   Mucus more yellow/ cough harsher / more doe so more saba, helps rec Omnicef 300mg  Twice daily  For 10 days Prednisone 10 mg take  4 each am x 2 days,   2 each am x 2 days,  1 each am x 2 days and stop  10/23/2013 f/u ov/Yue Glasheen re: acute exac bronchiectasis Chief Complaint  Patient presents with  . Acute Visit    Pt states that his cough is worse since the last visit- prod with large amounts of yellow sputum.    dulera 100 2bid / already used performist and ventolin on day of ov Better until 8/9 then severe flare, only used max of 2 tramadol per day and come to office s flutter with cough heard across the office  >augmentin /[pred rx   11/13/2013 Follow up and Med review  Patient returns for a followup and medication review. We reviewed all her medications and organized them into a medication calendar with patient education. Appears that she is taking her medications >. Last visit. Patient with a bronchiectatic exacerbation treated with Augmentin x10 days along with a prednisone taper. Patient says that she was feeling better but now cough /congestion are coming back. Has  prod cough with yellow mucus, wheezing, increased SOB, sore throat, tightness in chest, head congestion w/ PND and hoarseness x1-2 weeks.  Denies fever, hemoptysis, n/v/d. Cough is violent at times.  We discussed cough control regimen.  Has been on 2 10 day courses of abx in last 4 weeks rec Check sputum > no pathogens CT Chest / sinus ok     12/13/2013 f/u ov/Sejla Marzano re:  Chief Complaint  Patient presents with  . Follow-up    Pt states that her cough has improved some- occ prod with minimal yellow sputum. Breathing has improved some, and is almost back to her normal baseline. She is using rescue inhaler at least 3 x per day and neb 1 x per day.    Using saba for cough seems to help but not listed for this indication on the med calendar  No obvious day to day or daytime variabilty or assoc sob or cp or chest tightness, subjective wheeze overt sinus or hb symptoms. No unusual exp hx or h/o childhood pna/ asthma or knowledge of premature birth.  Sleeping ok without nocturnal  or early am exacerbation  of respiratory  c/o's or need for noct saba. Also denies any obvious fluctuation of symptoms with weather or environmental changes or other aggravating or alleviating factors except as outlined above   Current Medications, Allergies, Complete Past Medical History, Past Surgical History, Family History, and Social History were reviewed in [employer] record.  ROS  The following are not active complaints unless bolded sore throat, dysphagia, dental problems, itching, sneezing,  nasal congestion or excess/ purulent secretions, ear ache,   fever, chills, sweats, unintended wt loss, pleuritic or exertional cp, hemoptysis,  orthopnea pnd or leg swelling, presyncope, palpitations, heartburn, abdominal pain, anorexia, nausea, vomiting, diarrhea  or change in bowel or urinary habits, change in stools or urine, dysuria,hematuria,  rash, arthralgias, visual complaints, headache, numbness weakness or ataxia or problems with walking or coordination,  change in mood/affect or memory.                 Objective:   Physical Exam  amb wf mildly  nad      Wt 156 08/06/2011  > 156 12/07/2011 > 12/27/2011  155>155 01/10/12 > 02/18/2012  158 > 157 03/24/2012  > 11/01/2012   149 > 150 11/20/2012 > 154  04/25/2013 >152 05/14/2013 > 06/25/2013   148 > 10/16/2013  147 > 10/23/2013 149>150 11/13/2013 > 12/13/2013  147   HEENT: nl dentition, turbinates, and orophanx. Nl external ear canals without cough reflex, mild turbinate edema.  Post pharynx clear, no exudate/thrush    NECK :  without JVD/Nodes/TM/ nl carotid upstrokes bilaterally   LUNGS: no acc muscle use, CTA ,   Min bilateral exp rhonchi   CV:  RRR  no s3 or murmur or increase in P2, no edema   ABD:  soft and nontender with nl excursion in the supine position. No bruits or organomegaly, bowel sounds nl  MS:  warm without deformities, calf tenderness, cyanosis or clubbing      CXR  10/23/2013 : Stable cardiac and mediastinal contours. No consolidative pulmonary opacities. No pleural effusion or pneumothorax. Regional skeleton is unremarkable. Old left rib fracture.   .  Assessment & Plan:

## 2013-12-13 NOTE — Patient Instructions (Addendum)
Work on Musicianperfecting  inhaler technique:  relax and gently blow all the way out then take a nice smooth deep breath back in, triggering the inhaler at same time you start breathing in.  Hold for up to 5 seconds if you can.  Rinse and gargle with water when done  See calendar for specific medication instructions and bring it back for each and every office visit for every healthcare provider you see.  Without it,  you may not receive the best quality medical care that we feel you deserve.  You will note that the calendar groups together  your maintenance  medications that are timed at particular times of the day.  Think of this as your checklist for what your doctor has instructed you to do until your next evaluation to see what benefit  there is  to staying on a consistent group of medications intended to keep you well.  The other group at the bottom is entirely up to you to use as you see fit  for specific symptoms that may arise between visits that require you to treat them on an as needed basis.  Think of this as your action plan or "what if" list.   Separating the top medications from the bottom group is fundamental to providing you adequate care going forward.   Please schedule a follow up office visit in 6 weeks, call sooner if needed

## 2014-01-24 ENCOUNTER — Ambulatory Visit (INDEPENDENT_AMBULATORY_CARE_PROVIDER_SITE_OTHER): Payer: Medicare Other | Admitting: Internal Medicine

## 2014-01-24 ENCOUNTER — Encounter: Payer: Self-pay | Admitting: Internal Medicine

## 2014-01-24 VITALS — BP 152/76 | HR 97 | Temp 97.9°F | Ht 61.0 in | Wt 149.8 lb

## 2014-01-24 DIAGNOSIS — Z23 Encounter for immunization: Secondary | ICD-10-CM

## 2014-01-24 DIAGNOSIS — R05 Cough: Secondary | ICD-10-CM

## 2014-01-24 DIAGNOSIS — R059 Cough, unspecified: Secondary | ICD-10-CM

## 2014-01-24 DIAGNOSIS — J4530 Mild persistent asthma, uncomplicated: Secondary | ICD-10-CM

## 2014-01-24 DIAGNOSIS — J479 Bronchiectasis, uncomplicated: Secondary | ICD-10-CM

## 2014-01-24 NOTE — Patient Instructions (Addendum)
See calendar for specific medication instructions and bring it back for each and every office visit for every healthcare provider you see.  Without it,  you may not receive the best quality medical care that we feel you deserve.  You will note that the calendar groups together  your maintenance  medications that are timed at particular times of the day.  Think of this as your checklist for what your doctor has instructed you to do until your next evaluation to see what benefit  there is  to staying on a consistent group of medications intended to keep you well.  The other group at the bottom is entirely up to you to use as you see fit  for specific symptoms that may arise between visits that require you to treat them on an as needed basis.  Think of this as your action plan or "what if" list.   Separating the top medications from the bottom group is fundamental to providing you adequate care going forward.    Please schedule a follow up visit in 3 months but call sooner if needed to see Tammy NP with all your medications to rewrite your med calendar  Late add consider increase dulera to 200 if not well controlled on f/u

## 2014-01-24 NOTE — Assessment & Plan Note (Addendum)
-   Sinus CT 11/21/2013 > Mild mucosal thickening inferior right maxillary sinus.    Reminded to use mucinex dm and flutter aggressively to try to limit cyclical coughing given how violently she tends to cough    Each maintenance medication was reviewed in detail including most importantly the difference between maintenance and as needed and under what circumstances the prns are to be used. This was done in the context of a medication calendar review which provided the patient with a user-friendly unambiguous mechanism for medication administration and reconciliation and provides an action plan for all active problems. It is critical that this be shown to every doctor  for modification during the office visit if necessary so the patient can use it as a working document.

## 2014-01-24 NOTE — Assessment & Plan Note (Addendum)
-    CT chest 01/10/12 1. Bilateral bronchiectasis is most evident in the left lower lobe where ground-glass attenuation and scarring is evident. No significant airspace consolidation is present. 2. Upper lobe bronchiectasis is slightly worse on the right. No significant parenchymal disease is associated. 3. Single calcified right paratracheal node may represent treated disease or a history granulomatous disease. Alpha one AT >  MM  02/2012  PFT"s wnl 11/01/2012  Prevar rx 06/25/13  CTa 11/21/13 > no acute changes   Adequate control on present rx, reviewed > no change in rx needed    rec pneumovax to complete her rx

## 2014-01-24 NOTE — Progress Notes (Signed)
Subjective:    Patient ID: Caitlin Newman, female    DOB: December 02, 1945   MRN: [id number]  Brief patient profile:  68 yowf no sign smoking hx  With lots of wheezing as child outgrew at very young age (doesn't remember) then recurrent wheezing coughing worse in spring since around 1980 [facility] until Nov 2012 with refractory cough since then so referred 08/06/2011 to pulmonary clinic by PA Ninfa [place]athy [place] with nl pfts 11/01/2012 including fef 25-75    History of Present Illness  08/06/2011 1st pulmonary eval/ Dennise Bamber cc persistent cough x 6 months acute onset with "flu" in November now present daily  - had been on advair maint and ACEI but the latter was stopped early May 2013 no improvement cough which is worse p eat and afternoons and cough for an hour at bedtime with sense of something stuck upper mid chest and coughs to point of choking/vomiting>  Productive of min yellow mucus. rec Stop advair, tessilon - only use albuterol (ventolin) puffer as rescue Start Pulmicort twice daily with nebulized albuterol until cough completely gone for a couple of weeks then restart the advair  Schedule sinus ct > Mild chronic sinusitis with mucosal edema in the maxillary sinus  bilaterally. Try prilosec 20mg   Take 30-60 min before first meal of the day and Pepcid 20 mg one bedtime until cough is completely gone for at least a week without the need for cough suppression GERD diet Prednisone 10 mg take  4 each am x 2 days,   2 each am x 2 days,  1 each am x2days and stop     10/18/2011 f/u ov/Yancy Knoble cc got some better but never completely then resumed advair and gradually worsening  Cough again  to the point of choking and vomiting despite rx with  ppi and h2hs- no sob unless coughing Sinus symptoms have been about avg and doesn't use anything for them.  Prednisone did not help previously. rec Stop advair, tessilon - only use albuterol (ventolin) puffer as rescue Start Pulmicort  twice daily with nebulized performist until return Cyclical cough rx  01/10/12 Follow up and med review    Patient returns for a two-week followup and medication review. We reviewed all her medications and organized them into a medication calendar with patient education. She complains that her cough is minimally improved Seen 2 weeks ago and started on dulera , singulair , chlortrimeton .  rec Add Delsym 2 tsp Twice daily  For cough  Add Tramadol 50mg  1-2 every 4hr as needed for breakthrough cough  Goal is NO COUGHING -NO THROAT CLEARING    02/18/2012 f/u ov/Zyad Boomer cc much worse x one week with increase cough/ congestion and yellow mucus, best response was to prednisone, neb laba/ics and augmentin, still not able to use hfa effectively. Also sob with minimal activity. Using med calendar better x doesn't understand how to maximize prns  rec As per med calendar: Stop singulair and dulera Maximize mucinex dm to 2 every 12 hours as needed for cough and congestion Start performist and budesonide twice daily Prednisone 10 mg take  4 each am x 2 days,   2 each am x 2 days,  1 each am x2days and stop  Augmentin 875 twice daily x 10 days   03/24/2012 f/u ov/Dael Howland using calendar, cc much better breathing/ cough on performist/ bud still needs ventolin 2 x weekly and still somewhat hoarse. rec No change, follow med calendar   11/01/2012  f/u ov/Zephyra Bernardi no longer using med calendar, off pulmicort x 10/16/12 just using performist due to cost Chief Complaint  Patient presents with  . Asthma    Breathing is doing well. Reports SOB. Denies coughing or chest tightness.  only wheezing with heavy exertion worse off ics with increase need for saba  >>d/c nebs, retry Dulera   11/20/2012 Follow up and Med review  Patient returns for a two-week medication review. Reviewed all her medications and organized them into a medication calendar with patient education. Appears that she is taking her medications  correctly. [facility]/Brovana Nebs last ov.   Says that she is doing well on Dulera. No flare in wheezing , cough or dyspnea.  She is fully retired now. And she has rare use of her Ventolin inhaler.   04/25/2013 f/u ov/Vaiden Adames re: refractory cough maint on dulera 100 with poor hfa  Chief Complaint  Patient presents with  . Follow-up    Cough worse since end of Dec- prod with minimal yellow sputum.  She also c/o PND. Using ventolin approx bid.   did well 11/20/12 ov to x mas then caught cold, got neb alb and traveled to [place] and back with persistent hacking daytime cough and using the neb avg twice daily for relief of sob with more than adls. >>Augmentin and pred   05/14/2013 Follow up and Med review / NP Patient returns for a followup visit and medication review Unfortunately, patient did not bring her medications were reviewed today. We reviewed her medication list and updated it. We reviewed in her inhaler use. And suggested that she needs a spacer.  Patient says that she was feeling better. Shortly after her last visit. Patient had a bronchiectasis, exacerbation. It was treated with Augmentin and prednisone taper. Patient says that her symptoms did improve greatly. However, 3 days.prior to OV  . She developed her cough with thick, yellow. Mucus. She is trying to use. Tramadol and Mucinex with some relief. She denies any hemoptysis, orthopnea, PND, or leg swelling. rec Omnicef 300mg  Twice daily  For 10 days . Take w/ food.  Eat yogurt.  Start Probiotic -Align- daily for 2 weeks.  Follow med calendar for cough control .  Chest xray today .  Spacer given    06/25/2013 f/u ov/Fredrica Capano re:  Bronchiectasis  Chief Complaint  Patient presents with  . Follow-up    Pt reports her cough and SOB have improved. No new co's today.  Not limited by breathing from desired activities  - saba 3 x weekly, no neb, walking 2 m daily, playing golf  Has "oyster" each am  and cough at bedtime non productive but overall much better  rec Prevar 13 today (last pneumonia shot you will need) See calendar for specific medication instructions     10/16/2013 f/u ov/Nandini Bogdanski re: bronchiectasis Chief Complaint  Patient presents with  . Follow-up    Pt states that her cough is unchanged.   No new co's today. She is using albuterol inhaler and albuterol in her neb at least once per day.    doing ok until x 2 weeks prior to OV , indolent onset grad worse cough>   Mucus more yellow/ cough harsher / more doe so more saba, helps rec Omnicef 300mg  Twice daily  For 10 days Prednisone 10 mg take  4 each am x 2 days,   2 each am x 2 days,  1 each am x 2 days and stop  10/23/2013 f/u ov/Doc Mandala re: acute exac bronchiectasis Chief Complaint  Patient presents with  . Acute Visit    Pt states that his cough is worse since the last visit- prod with large amounts of yellow sputum.    dulera 100 2bid / already used performist and ventolin on day of ov Better until 8/9 then severe flare, only used max of 2 tramadol per day and come to office s flutter with cough heard across the office  >augmentin /[pred rx   11/13/2013 Follow up and Med review  Patient returns for a followup and medication review. We reviewed all her medications and organized them into a medication calendar with patient education. Appears that she is taking her medications >. Last visit. Patient with a bronchiectatic exacerbation treated with Augmentin x10 days along with a prednisone taper. Patient says that she was feeling better but now cough /congestion are coming back. Has  prod cough with yellow mucus, wheezing, increased SOB, sore throat, tightness in chest, head congestion w/ PND and hoarseness x1-2 weeks.  Denies fever, hemoptysis, n/v/d. Cough is violent at times.  We discussed cough control regimen.  Has been on 2 10 day courses of abx in last 4 weeks rec Check sputum > no pathogens CT Chest / sinus ok     12/13/2013 f/u ov/Marzell Isakson re:  Chief Complaint  Patient presents with  . Follow-up    Pt states that her cough has improved some- occ prod with minimal yellow sputum. Breathing has improved some, and is almost back to her normal baseline. She is using rescue inhaler at least 3 x per day and neb 1 x per day.   Using saba for cough seems to help but not listed for this indication on the med calendar rec Work on inhaler   01/24/2014 f/u ov/Hans Rusher re: bronchiectasis s sign airflow obstr by pfts 11/01/12  Chief Complaint  Patient presents with  . Follow-up    Pt states that her cough is much improved. Breathing is doing well. No new co's today. She is using proair about 2 x per day and has used neb x 1 in the past wk.   thinks flutter helping   Mucus light yellow, never bloody Not limited by breathing from desired activities    No obvious day to day or daytime variabilty or assoc  cp or chest tightness, subjective wheeze overt sinus or hb symptoms. No unusual exp hx or h/o childhood pna/ asthma or knowledge of premature birth.  Sleeping ok without nocturnal  or early am exacerbation  of respiratory  c/o's or need for noct saba. Also denies any obvious fluctuation of symptoms with weather or environmental changes or other aggravating or alleviating factors except as outlined above   Current Medications, Allergies, Complete Past Medical History, Past Surgical History, Family History, and Social History were reviewed in [employer] record.  ROS  The following are not active complaints unless bolded sore throat, dysphagia, dental problems, itching, sneezing,  nasal congestion or excess/ purulent secretions, ear ache,   fever, chills, sweats, unintended wt loss, pleuritic or exertional cp, hemoptysis,  orthopnea pnd or leg swelling, presyncope, palpitations, heartburn, abdominal pain, anorexia, nausea, vomiting, diarrhea  or change in bowel or urinary habits, change in stools or  urine, dysuria,hematuria,  rash, arthralgias, visual complaints, headache, numbness weakness or ataxia or problems with walking or coordination,  change in mood/affect or memory.  Objective:   Physical Exam  amb wf mildly  nad      Wt 156 08/06/2011  > 156 12/07/2011 > 12/27/2011  155>155 01/10/12 > 02/18/2012  158 > 157 03/24/2012  > 11/01/2012   149 > 150 11/20/2012 > 154  04/25/2013 >152 05/14/2013 > 06/25/2013  148 > 10/16/2013  147 > 10/23/2013 149>150 11/13/2013 > 12/13/2013  147 > 01/24/2014 150   HEENT: nl dentition, turbinates, and orophanx. Nl external ear canals without cough reflex, mild turbinate edema.  Post pharynx clear, no exudate/thrush    NECK :  without JVD/Nodes/TM/ nl carotid upstrokes bilaterally   LUNGS: no acc muscle use, CTA ,     bilateral exp> insp rhonchi /Cough on exp  CV:  RRR  no s3 or murmur or increase in P2, no edema   ABD:  soft and nontender with nl excursion in the supine position. No bruits or organomegaly, bowel sounds nl  MS:  warm without deformities, calf tenderness, cyanosis or clubbing      CXR  10/23/2013 : Stable cardiac and mediastinal contours. No consolidative pulmonary opacities. No pleural effusion or pneumothorax. Regional skeleton is unremarkable. Old left rib fracture.      Assessment & Plan:

## 2014-01-24 NOTE — Assessment & Plan Note (Signed)
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  - hfa 75% p coaching 11/01/2012 and can't afford pulmocort neb > rechallenged with dulera 100 2bid    -Med calendar 11/20/2012   The proper method of use, as well as anticipated side effects, of a metered-dose inhaler are discussed and demonstrated to the patient. Improved effectiveness after extensive coaching during this visit to a level of approximately  90%   May consider increase the dulera to 200 next ov if having exac in meantime

## 2014-04-09 ENCOUNTER — Other Ambulatory Visit: Payer: Self-pay | Admitting: Internal Medicine

## 2014-04-25 ENCOUNTER — Encounter: Payer: Self-pay | Admitting: Adult Health

## 2014-04-25 ENCOUNTER — Ambulatory Visit (INDEPENDENT_AMBULATORY_CARE_PROVIDER_SITE_OTHER): Payer: Medicare Other | Admitting: Adult Health

## 2014-04-25 VITALS — BP 118/76 | HR 74 | Temp 97.7°F | Ht 61.0 in | Wt 153.4 lb

## 2014-04-25 DIAGNOSIS — J4530 Mild persistent asthma, uncomplicated: Secondary | ICD-10-CM

## 2014-04-25 DIAGNOSIS — J479 Bronchiectasis, uncomplicated: Secondary | ICD-10-CM

## 2014-04-25 NOTE — Assessment & Plan Note (Signed)
Compensated on present regimen Plan  Follow med calendar closely and bring to each visit.  Follow up Dr. Sherene SiresWert  In 4-6 months  and As needed   Please contact office for sooner follow up if symptoms do not improve or worsen or seek emergency care

## 2014-04-25 NOTE — Progress Notes (Signed)
Subjective:    Patient ID: Caitlin Newman, female    DOB: December 02, 1945   MRN: [id number]  Brief patient profile:  68 yowf no sign smoking hx  With lots of wheezing as child outgrew at very young age (doesn't remember) then recurrent wheezing coughing worse in spring since around 1980 [facility] until Nov 2012 with refractory cough since then so referred 08/06/2011 to pulmonary clinic by PA Ninfa [place]athy [place] from [place] with nl pfts 11/01/2012 including fef 25-75    History of Present Illness  08/06/2011 1st pulmonary eval/ Wert cc persistent cough x 6 months acute onset with "flu" in November now present daily  - had been on advair maint and ACEI but the latter was stopped early May 2013 no improvement cough which is worse p eat and afternoons and cough for an hour at bedtime with sense of something stuck upper mid chest and coughs to point of choking/vomiting>  Productive of min yellow mucus. rec Stop advair, tessilon - only use albuterol (ventolin) puffer as rescue Start Pulmicort twice daily with nebulized albuterol until cough completely gone for a couple of weeks then restart the advair  Schedule sinus ct > Mild chronic sinusitis with mucosal edema in the maxillary sinus  bilaterally. Try prilosec 20mg   Take 30-60 min before first meal of the day and Pepcid 20 mg one bedtime until cough is completely gone for at least a week without the need for cough suppression GERD diet Prednisone 10 mg take  4 each am x 2 days,   2 each am x 2 days,  1 each am x2days and stop     10/18/2011 f/u ov/Wert cc got some better but never completely then resumed advair and gradually worsening  Cough again  to the point of choking and vomiting despite rx with  ppi and h2hs- no sob unless coughing Sinus symptoms have been about avg and doesn't use anything for them.  Prednisone did not help previously. rec Stop advair, tessilon - only use albuterol (ventolin) puffer as rescue Start Pulmicort  twice daily with nebulized performist until return Cyclical cough rx  01/10/12 Follow up and med review    Patient returns for a two-week followup and medication review. We reviewed all her medications and organized them into a medication calendar with patient education. She complains that her cough is minimally improved Seen 2 weeks ago and started on dulera , singulair , chlortrimeton .  rec Add Delsym 2 tsp Twice daily  For cough  Add Tramadol 50mg  1-2 every 4hr as needed for breakthrough cough  Goal is NO COUGHING -NO THROAT CLEARING    02/18/2012 f/u ov/Wert cc much worse x one week with increase cough/ congestion and yellow mucus, best response was to prednisone, neb laba/ics and augmentin, still not able to use hfa effectively. Also sob with minimal activity. Using med calendar better x doesn't understand how to maximize prns  rec As per med calendar: Stop singulair and dulera Maximize mucinex dm to 2 every 12 hours as needed for cough and congestion Start performist and budesonide twice daily Prednisone 10 mg take  4 each am x 2 days,   2 each am x 2 days,  1 each am x2days and stop  Augmentin 875 twice daily x 10 days   03/24/2012 f/u ov/Wert using calendar, cc much better breathing/ cough on performist/ bud still needs ventolin 2 x weekly and still somewhat hoarse. rec No change, follow med calendar   11/01/2012  f/u ov/Wert no longer using med calendar, off pulmicort x 10/16/12 just using performist due to cost Chief Complaint  Patient presents with  . Asthma    Breathing is doing well. Reports SOB. Denies coughing or chest tightness.  only wheezing with heavy exertion worse off ics with increase need for saba  >>d/c nebs, retry Dulera   11/20/2012 Follow up and Med review  Patient returns for a two-week medication review. Reviewed all her medications and organized them into a medication calendar with patient education. Appears that she is taking her medications  correctly. [facility] Budesonide/Brovana Nebs last ov.   Says that she is doing well on Dulera. No flare in wheezing , cough or dyspnea.  She is fully retired now. And she has rare use of her Ventolin inhaler.   04/25/2013 f/u ov/Wert re: refractory cough maint on dulera 100 with poor hfa  Chief Complaint  Patient presents with  . Follow-up    Cough worse since end of Dec- prod with minimal yellow sputum.  She also c/o PND. Using ventolin approx bid.   did well 11/20/12 ov to x mas then caught cold, got neb alb and traveled to [place] and back with persistent hacking daytime cough and using the neb avg twice daily for relief of sob with more than adls. >>Augmentin and pred   05/14/2013 Follow up and Med review / NP Patient returns for a followup visit and medication review Unfortunately, patient did not bring her medications were reviewed today. We reviewed her medication list and updated it. We reviewed in her inhaler use. And suggested that she needs a spacer.  Patient says that she was feeling better. Shortly after her last visit. Patient had a bronchiectasis, exacerbation. It was treated with Augmentin and prednisone taper. Patient says that her symptoms did improve greatly. However, 3 days.prior to OV  . She developed her cough with thick, yellow. Mucus. She is trying to use. Tramadol and Mucinex with some relief. She denies any hemoptysis, orthopnea, PND, or leg swelling. rec Omnicef 300mg  Twice daily  For 10 days . Take w/ food.  Eat yogurt.  Start Probiotic -Align- daily for 2 weeks.  Follow med calendar for cough control .  Chest xray today .  Spacer given    06/25/2013 f/u ov/Wert re:  Bronchiectasis  Chief Complaint  Patient presents with  . Follow-up    Pt reports her cough and SOB have improved. No new co's today.  Not limited by breathing from desired activities  - saba 3 x weekly, no neb, walking 2 m daily, playing golf  Has "oyster" each am  and cough at bedtime non productive but overall much better  rec Prevar 13 today (last pneumonia shot you will need) See calendar for specific medication instructions     10/16/2013 f/u ov/Wert re: bronchiectasis Chief Complaint  Patient presents with  . Follow-up    Pt states that her cough is unchanged.   No new co's today. She is using albuterol inhaler and albuterol in her neb at least once per day.    doing ok until x 2 weeks prior to OV , indolent onset grad worse cough>   Mucus more yellow/ cough harsher / more doe so more saba, helps rec Omnicef 300mg  Twice daily  For 10 days Prednisone 10 mg take  4 each am x 2 days,   2 each am x 2 days,  1 each am x 2 days and stop  10/23/2013 f/u ov/Wert re: acute exac bronchiectasis Chief Complaint  Patient presents with  . Acute Visit    Pt states that his cough is worse since the last visit- prod with large amounts of yellow sputum.    dulera 100 2bid / already used performist and ventolin on day of ov Better until 8/9 then severe flare, only used max of 2 tramadol per day and come to office s flutter with cough heard across the office  >augmentin /[pred rx   11/13/2013 Follow up and Med review  Patient returns for a followup and medication review. We reviewed all her medications and organized them into a medication calendar with patient education. Appears that she is taking her medications >. Last visit. Patient with a bronchiectatic exacerbation treated with Augmentin x10 days along with a prednisone taper. Patient says that she was feeling better but now cough /congestion are coming back. Has  prod cough with yellow mucus, wheezing, increased SOB, sore throat, tightness in chest, head congestion w/ PND and hoarseness x1-2 weeks.  Denies fever, hemoptysis, n/v/d. Cough is violent at times.  We discussed cough control regimen.  Has been on 2 10 day courses of abx in last 4 weeks rec Check sputum > no pathogens CT Chest / sinus ok     12/13/2013 f/u ov/Wert re:  Chief Complaint  Patient presents with  . Follow-up    Pt states that her cough has improved some- occ prod with minimal yellow sputum. Breathing has improved some, and is almost back to her normal baseline. She is using rescue inhaler at least 3 x per day and neb 1 x per day.   Using saba for cough seems to help but not listed for this indication on the med calendar rec Work on inhaler   01/24/2014 f/u ov/Wert re: bronchiectasis s sign airflow obstr by pfts 11/01/12  Chief Complaint  Patient presents with  . Follow-up    Pt states that her cough is much improved. Breathing is doing well. No new co's today. She is using proair about 2 x per day and has used neb x 1 in the past wk.   thinks flutter helping   Mucus light yellow, never bloody Not limited by breathing from desired activities   >>  04/25/2014 Follow up and Med review : Bronchiectasis /Asthma  s sign airflow obstr by pfts 11/01/12  Returns for a three-month follow-up and medication review We reviewed all her medications organize them into a medication count with patient education Appears she is taking her medications correctly. Patient says overall she's been doing well . Had URI symptoms with cough and congestion. 2-3 weeks ago, symptoms resolved Mucinex and albuterol nebulizer treatments. She denies any chest pain, orthopnea, PND or increased leg swelling.   Current Medications, Allergies, Complete Past Medical History, Past Surgical History, Family History, and Social History were reviewed in [employer] record.  ROS  The following are not active complaints unless bolded sore throat, dysphagia, dental problems, itching, sneezing,  nasal congestion or excess/ purulent secretions, ear ache,   fever, chills, sweats, unintended wt loss, pleuritic or exertional cp, hemoptysis,  orthopnea pnd or leg swelling, presyncope, palpitations, heartburn, abdominal pain, anorexia,  nausea, vomiting, diarrhea  or change in bowel or urinary habits, change in stools or urine, dysuria,hematuria,  rash, arthralgias, visual complaints, headache, numbness weakness or ataxia or problems with walking or coordination,  change in mood/affect or memory.  Objective:   Physical Exam  amb wf mildly  nad      Wt 156 08/06/2011  > 156 12/07/2011 > 12/27/2011  155>155 01/10/12 > 02/18/2012  158 > 157 03/24/2012  > 11/01/2012   149 > 150 11/20/2012 > 154  04/25/2013 >152 05/14/2013 > 06/25/2013  148 > 10/16/2013  147 > 10/23/2013 149>150 11/13/2013 > 12/13/2013  147 > 01/24/2014 150 >153 04/25/2014   HEENT: nl dentition, turbinates, and orophanx. Nl external ear canals without cough reflex, mild turbinate edema.  Post pharynx clear, no exudate/thrush    NECK :  without JVD/Nodes/TM/ nl carotid upstrokes bilaterally   LUNGS: no acc muscle use, CTA ,   No wheezing   CV:  RRR  no s3 or murmur or increase in P2, no edema   ABD:  soft and nontender with nl excursion in the supine position. No bruits or organomegaly, bowel sounds nl  MS:  warm without deformities, calf tenderness, cyanosis or clubbing      CXR  10/23/2013 : Stable cardiac and mediastinal contours. No consolidative pulmonary opacities. No pleural effusion or pneumothorax. Regional skeleton is unremarkable. Old left rib fracture.      Assessment & Plan:

## 2014-04-25 NOTE — Assessment & Plan Note (Signed)
Compensated without exacerbation  Plan Follow med calendar closely and bring to each visit.  Follow up Dr. Sherene SiresWert  In 4-6 months  and As needed   Please contact office for sooner follow up if symptoms do not improve or worsen or seek emergency care

## 2014-04-25 NOTE — Patient Instructions (Signed)
Follow med calendar closely and bring to each visit.  Follow up Dr. Sherene SiresWert  In 4-6 months  and As needed   Please contact office for sooner follow up if symptoms do not improve or worsen or seek emergency care

## 2014-05-02 NOTE — Addendum Note (Signed)
Addended by: Boone MasterJONES, Yena Tisby E on: 05/02/2014 01:43 PM   Modules accepted: Orders, Medications

## 2014-05-23 ENCOUNTER — Other Ambulatory Visit: Payer: Self-pay | Admitting: Internal Medicine

## 2014-05-23 MED ORDER — MOMETASONE FURO-FORMOTEROL FUM 100-5 MCG/ACT IN AERO: 2.0000 | INHALATION_SPRAY | Freq: Two times a day (BID) | RESPIRATORY_TRACT | Status: DC

## 2014-05-27 ENCOUNTER — Other Ambulatory Visit: Payer: Self-pay | Admitting: Internal Medicine

## 2014-09-03 ENCOUNTER — Other Ambulatory Visit: Payer: Medicare Other

## 2014-09-03 ENCOUNTER — Ambulatory Visit (INDEPENDENT_AMBULATORY_CARE_PROVIDER_SITE_OTHER)
Admission: RE | Admit: 2014-09-03 | Discharge: 2014-09-03 | Disposition: A | Discharge status: Home / Self Care | Payer: Medicare Other | Source: Ambulatory Visit | Attending: Internal Medicine | Admitting: Internal Medicine

## 2014-09-03 ENCOUNTER — Ambulatory Visit: Payer: Medicare Other | Admitting: Internal Medicine

## 2014-09-03 ENCOUNTER — Ambulatory Visit (INDEPENDENT_AMBULATORY_CARE_PROVIDER_SITE_OTHER): Payer: Medicare Other | Admitting: Internal Medicine

## 2014-09-03 ENCOUNTER — Encounter: Payer: Self-pay | Admitting: Internal Medicine

## 2014-09-03 VITALS — BP 124/74 | HR 99 | Ht 61.0 in | Wt 152.8 lb

## 2014-09-03 DIAGNOSIS — J471 Bronchiectasis with (acute) exacerbation: Secondary | ICD-10-CM | POA: Diagnosis not present

## 2014-09-03 DIAGNOSIS — R05 Cough: Secondary | ICD-10-CM

## 2014-09-03 DIAGNOSIS — R059 Cough, unspecified: Secondary | ICD-10-CM

## 2014-09-03 MED ORDER — TRAMADOL HCL 50 MG PO TABS: ORAL_TABLET | ORAL | Status: DC

## 2014-09-03 MED ORDER — PREDNISONE 10 MG PO TABS: ORAL_TABLET | ORAL | Status: DC

## 2014-09-03 MED ORDER — AMOXICILLIN-POT CLAVULANATE 875-125 MG PO TABS: 1.0000 | ORAL_TABLET | Freq: Two times a day (BID) | ORAL | Status: DC

## 2014-09-03 NOTE — Patient Instructions (Addendum)
Augmentin 875 mg take one pill twice daily  X 10 days - take at breakfast and supper with large glass of water.  It would help reduce the usual side effects (diarrhea and yeast infections) if you ate cultured yogurt at lunch.   Prednisone 10 mg take  4 each am x 2 days,   2 each am x 2 days,  1 each am x 2 days and stop   Ok to repeat the augmentin x 10 days any time your mucus turns nasty  Remember anytime you have a cough reach for the flutter valve first if you can and cough into it and if can't control the cough add tramadol  Please remember to go to the x-ray department downstairs for your tests - we will call you with the results when they are available.     See Tammy NP 3 months with all your medications, even over the counter meds, separated in two separate bags, the ones you take no matter what vs the ones you stop once you feel better and take only as needed when you feel you need them.   Tammy  will generate for you a new user friendly medication calendar that will put [place] all on the same page re: your medication use.

## 2014-09-03 NOTE — Progress Notes (Signed)
Subjective:    Patient ID: Caitlin Newman, female    DOB: [birth year]   MRN: [id number]  Brief patient profile:  68 yowf no sign smoking hx  With lots of wheezing as child outgrew at very young age (doesn't remember exactly when) then recurrent wheezing coughing worse in spring since around 1980 prev seen by [facility] well controlled until Nov 2012 with refractory cough since then so referred 08/06/2011 to pulmonary clinic by PA Caitlin Newman from [place] with brochiectasis on CT 12/2011 and  nl pfts 11/01/2012    History of Present Illness  08/06/2011 1st pulmonary eval/ Caitlin Newman cc persistent cough x 6 months acute onset with "flu" in November now present daily  - had been on advair maint and ACEI but the latter was stopped early May 2013 no improvement cough which is worse p eat and afternoons and cough for an hour at bedtime with sense of something stuck upper mid chest and coughs to point of choking/vomiting>  Productive of min yellow mucus. rec Stop advair, tessilon - only use albuterol (ventolin) puffer as rescue Start Pulmicort twice daily with nebulized albuterol until cough completely gone for a couple of weeks then restart the advair  Schedule sinus ct > Mild chronic sinusitis with mucosal edema in the maxillary sinus  bilaterally. Try prilosec   Take 30-60 min before first meal of the day and Pepcid 20 mg one bedtime until cough is completely gone for at least a week without the need for cough suppression GERD diet Prednisone 10 mg take  4 each am x 2 days,   2 each am x 2 days,  1 each am x2days and stop      01/10/12 Follow up and med review    Patient returns for a two-week followup and medication review. We reviewed all her medications and organized them into a medication calendar with patient education. She complains that her cough is minimally improved Seen 2 weeks ago and started on dulera , singulair , chlortrimeton .  rec Add Delsym 2 tsp Twice daily  For cough  Add  Tramadol  1-2 every 4hr as needed for breakthrough cough  Goal is NO COUGHING -NO THROAT CLEARING     11/13/2013 Follow up and Med review  Patient returns for a followup and medication review. We reviewed all her medications and organized them into a medication calendar with patient education. Appears that she is taking her medications >. Last visit. Patient with a bronchiectatic exacerbation treated with Augmentin x10 days along with a prednisone taper. Patient says that she was feeling better but now cough /congestion are coming back. Has  prod cough with yellow mucus, wheezing, increased SOB, sore throat, tightness in chest, head congestion w/ PND and hoarseness x1-2 weeks.  Denies fever, hemoptysis, n/v/d. Cough is violent at times.  We discussed cough control regimen.  Has been on 2 10 day courses of abx in last 4 weeks rec Check sputum > no pathogens CT Chest / sinus ok        01/24/2014 f/u ov/Caitlin Newman re: bronchiectasis s sign airflow obstr by pfts 11/01/12  Chief Complaint  Patient presents with  . Follow-up    Pt states that her cough is much improved. Breathing is doing well. No new co's today. She is using proair about 2 x per day and has used neb x 1 in the past wk.   thinks flutter helping   Mucus light yellow, never bloody Not limited by breathing from desired activities  rec See calendar for specific medication  Late add consider increase dulera to 200 if not well controlled on f/u     09/03/2014  Acute ov/Caitlin Newman re: bronchiectasis  Chief Complaint  Patient presents with  . Acute Visit    Pt c/o increased SOB, cough, chest tightness x 1 wk. She is coughing up very minimal clear sputum. She is using ventolin 3 x per day and neb x 1 per day.    holding out on using tramadol until "coughs so hard she hurts " Has med calendar/ not following action plans      No obvious day to day or daytime variabilty or assoc chronic cough or cp or chest tightness, subjective wheeze  overt sinus or hb symptoms. No unusual exp hx or h/o childhood pna/ asthma or knowledge of premature birth.  Sleeping ok without nocturnal  or early am exacerbation  of respiratory  c/o's or need for noct saba. Also denies any obvious fluctuation of symptoms with weather or environmental changes or other aggravating or alleviating factors except as outlined above   Current Medications, Allergies, Complete Past Medical History, Past Surgical History, Family History, and Social History were reviewed in [employer] record.  ROS  The following are not active complaints unless bolded sore throat, dysphagia, dental problems, itching, sneezing,  nasal congestion or excess/ purulent secretions, ear ache,   fever, chills, sweats, unintended wt loss, pleuritic or exertional cp, hemoptysis,  orthopnea pnd or leg swelling, presyncope, palpitations, abdominal pain, anorexia, nausea, vomiting, diarrhea  or change in bowel or urinary habits, change in stools or urine, dysuria,hematuria,  rash, arthralgias, visual complaints, headache, numbness weakness or ataxia or problems with walking or coordination,  change in mood/affect or memory.                      Objective:   Physical Exam  amb wf    With extremely harsh cough/ no flutter valve on hand       Wt 156 08/06/2011  > 156 12/07/2011 > 12/27/2011  155>155 01/10/12 > 02/18/2012  158 > 157 03/24/2012  > 11/01/2012   149 > 150 11/20/2012 > 154  04/25/2013 >152 05/14/2013 > 06/25/2013  148 > 10/16/2013  147 > 10/23/2013 149>150 11/13/2013 > 12/13/2013  147 > 01/24/2014 150 >153 04/25/2014 >  09/03/2014   HEENT: nl dentition, turbinates, and orophanx. Nl external ear canals without cough reflex, mild turbinate edema.  Post pharynx clear, no exudate/thrush    NECK :  without JVD/Nodes/TM/ nl carotid upstrokes bilaterally   LUNGS: no acc muscle use, insp and exp rhonchi bilaterally ,      CV:  RRR  no s3 or murmur or increase in P2, no edema    ABD:  soft and nontender with nl excursion in the supine position. No bruits or organomegaly, bowel sounds nl  MS:  warm without deformities, calf tenderness, cyanosis or clubbing       CXR PA and Lateral:   09/03/2014 :     I personally reviewed images and agree with radiology impression as follows:     Mild hyperinflation is unchanged. Negative for pneumonia. Heart size and vascularity normal. Negative for mass. Mild apical scarring bilaterally    Assessment & Plan:

## 2014-09-03 NOTE — Progress Notes (Signed)
Quick Note:  Spoke with pt and notified of results per Dr. Wert. Pt verbalized understanding and denied any questions.  ______ 

## 2014-09-04 ENCOUNTER — Encounter: Payer: Self-pay | Admitting: Internal Medicine

## 2014-09-04 NOTE — Assessment & Plan Note (Addendum)
-    CT chest 01/10/12 1. Bilateral bronchiectasis is most evident in the left lower lobe where ground-glass attenuation and scarring is evident. No significant airspace consolidation is present. 2. Upper lobe bronchiectasis is slightly worse on the right. No significant parenchymal disease is associated. 3. Single calcified right paratracheal node may represent treated disease or a history granulomatous disease. Alpha one AT >  MM  02/2012  PFT's wnl 11/01/2012  Prevar rx 06/25/13 and pneumovax 01/24/2014  CTa 11/21/13 > no acute changes    Acute flare/ not following action plan as written  I had an extended discussion with the patient reviewing all relevant studies completed to date and  lasting 15 to 20 minutes of a 25 minute visit on the following ongoing concerns:  -Each maintenance medication was reviewed in detail including most importantly the difference between maintenance and as needed and under what circumstances the prns are to be used. This was done in the context of a medication calendar review which provided the patient with a user-friendly unambiguous mechanism for medication administration and reconciliation and provides an action plan for all active problems. It is critical that this be shown to every doctor  for modification during the office visit if necessary so the patient can use it as a working document.   - will add prn 10 cycle of augmentin

## 2014-09-04 NOTE — Assessment & Plan Note (Signed)
She has a striking tendency to cyclical cough/ advised this is like slamming a sprained ankle on the ground and we have given there the tools to control it.   I emphasized to the patient that just like Dorothy in [employer],  She is already wearing "ruby slippers" she can click anytime she wants:  the answer to all her recurrent symptoms  is already  literally at her fingertips:   all she has to do is refer to the medicine calendar we provided her  and her problems  are each addressed in a user-friendly format, reading from left to right,  for each symptoms she's likely to develop between office visits.    Asked her to please not over think the symptoms  (doesn't matter whether it's a cold/allergy/sinus etc as the symptoms all look the same and are addressed on her med calendar)

## 2014-09-05 ENCOUNTER — Ambulatory Visit: Payer: Medicare Other | Admitting: Internal Medicine

## 2014-12-04 ENCOUNTER — Encounter: Payer: Self-pay | Admitting: Adult Health

## 2014-12-04 ENCOUNTER — Ambulatory Visit (INDEPENDENT_AMBULATORY_CARE_PROVIDER_SITE_OTHER): Payer: Medicare Other | Admitting: Adult Health

## 2014-12-04 VITALS — BP 112/78 | HR 90 | Temp 97.0°F | Ht 61.0 in | Wt 144.0 lb

## 2014-12-04 DIAGNOSIS — Z79899 Other long term (current) drug therapy: Secondary | ICD-10-CM

## 2014-12-04 DIAGNOSIS — J479 Bronchiectasis, uncomplicated: Secondary | ICD-10-CM | POA: Diagnosis not present

## 2014-12-04 DIAGNOSIS — K219 Gastro-esophageal reflux disease without esophagitis: Secondary | ICD-10-CM | POA: Diagnosis not present

## 2014-12-04 DIAGNOSIS — Z23 Encounter for immunization: Secondary | ICD-10-CM

## 2014-12-04 MED ORDER — FLUCONAZOLE 150 MG PO TABS: 150.0000 mg | ORAL_TABLET | Freq: Once | ORAL | Status: DC

## 2014-12-04 NOTE — Progress Notes (Signed)
Subjective:    Patient ID: Caitlin Newman, female    DOB: 16-Dec-1945   MRN: [id number]  Brief patient profile:  68 yowf no sign smoking hx  With lots of wheezing as child outgrew at very young age (doesn't remember exactly when) then recurrent wheezing coughing worse in spring since around 1980 prev seen by [facility] well controlled until Nov 2012 with refractory cough since then so referred 08/06/2011 to pulmonary clinic by PA Ninfa Linden from [place] with brochiectasis on CT 12/2011 and  nl pfts 11/01/2012    History of Present Illness  08/06/2011 1st pulmonary eval/ [facility] cc persistent cough x 6 months acute onset with "flu" in November now present daily  - had been on advair maint and ACEI but the latter was stopped early May 2013 no improvement cough which is worse p eat and afternoons and cough for an hour at bedtime with sense of something stuck upper mid chest and coughs to point of choking/vomiting>  Productive of min yellow mucus. rec Stop advair, tessilon - only use albuterol (ventolin) puffer as rescue Start Pulmicort twice daily with nebulized albuterol until cough completely gone for a couple of weeks then restart the advair  Schedule sinus ct > Mild chronic sinusitis with mucosal edema in the maxillary sinus  bilaterally. Try prilosec   Take 30-60 min before first meal of the day and Pepcid 20 mg one bedtime until cough is completely gone for at least a week without the need for cough suppression GERD diet Prednisone 10 mg take  4 each am x 2 days,   2 each am x 2 days,  1 each am x2days and stop      01/10/12 Follow up and med review    Patient returns for a two-week followup and medication review. We reviewed all her medications and organized them into a medication calendar with patient education. She complains that her cough is minimally improved Seen 2 weeks ago and started on dulera , singulair , chlortrimeton .  rec Add Delsym 2 tsp Twice daily  For cough  Add  Tramadol  1-2 every 4hr as needed for breakthrough cough  Goal is NO COUGHING -NO THROAT CLEARING     11/13/2013 Follow up and Med review  Patient returns for a followup and medication review. We reviewed all her medications and organized them into a medication calendar with patient education. Appears that she is taking her medications >. Last visit. Patient with a bronchiectatic exacerbation treated with Augmentin x10 days along with a prednisone taper. Patient says that she was feeling better but now cough /congestion are coming back. Has  prod cough with yellow mucus, wheezing, increased SOB, sore throat, tightness in chest, head congestion w/ PND and hoarseness x1-2 weeks.  Denies fever, hemoptysis, n/v/d. Cough is violent at times.  We discussed cough control regimen.  Has been on 2 10 day courses of abx in last 4 weeks rec Check sputum > no pathogens CT Chest / sinus ok        01/24/2014 f/u ov/[facility] re: bronchiectasis s sign airflow obstr by pfts 11/01/12  Chief Complaint  Patient presents with  . Follow-up    Pt states that her cough is much improved. Breathing is doing well. No new co's today. She is using proair about 2 x per day and has used neb x 1 in the past wk.   thinks flutter helping   Mucus light yellow, never bloody Not limited by breathing from desired activities  rec See calendar for specific medication  Late add consider increase dulera to 200 if not well controlled on f/u     09/03/2014  Acute ov/[facility] re: bronchiectasis  Chief Complaint  Patient presents with  . Acute Visit    Pt c/o increased SOB, cough, chest tightness x 1 wk. She is coughing up very minimal clear sputum. She is using ventolin 3 x per day and neb x 1 per day.    holding out on using tramadol until "coughs so hard she hurts " Has med calendar/ not following action plans   >>augmentin and pred   12/04/2014 Follow up : Bronchiectasis  Returns for 3 month follow up. Says overall she is  doing okay , had flare last ov , tx w/ augmentin and prednisone taper.  She got better . Has 1 other flare since last ov, resolved with augmentin.  She is feeling good today.  Requests to have diflucan dose to use if she gets yeast infection on abx. Very prone to this .  Advised to hold statin while on this for few days .  We reviewed all her meds and organized them into a med calendar with pt education  Appears to be taking correctly , except confused with reflux meds .  Taking prilosec, nexium at bedtime and stopped pepcid.  She was recently started on nexium by PCP for reflux on prilosec.  She is suppose to be taking prilosec in am before meal and pepcid at bedtime  Says she gets reflux mainly at night when she lies down.  We discussed GERD diet and correct use of PPI/H2 blocker.  She denies chest pain, orthopnea, edema or hemoptysis .      Current Medications, Allergies, Complete Past Medical History, Past Surgical History, Family History, and Social History were reviewed in [employer] record.  ROS  The following are not active complaints unless bolded sore throat, dysphagia, dental problems, itching, sneezing,   , ear ache,   fever, chills, sweats, unintended wt loss, pleuritic or exertional cp, hemoptysis,  orthopnea pnd or leg swelling, presyncope, palpitations, abdominal pain, anorexia, nausea, vomiting, diarrhea  or change in bowel or urinary habits, change in stools or urine, dysuria,hematuria,  rash, arthralgias, visual complaints, headache, numbness weakness or ataxia or problems with walking or coordination,  change in mood/affect or memory.                      Objective:   Physical Exam  amb wf     Wt 156 08/06/2011  > 156 12/07/2011 > 12/27/2011  155>155 01/10/12 > 02/18/2012  158 > 157 03/24/2012  > 11/01/2012   149 > 150 11/20/2012 > 154  04/25/2013 >152 05/14/2013 > 06/25/2013  148 > 10/16/2013  147 > 10/23/2013 149>150 11/13/2013 > 12/13/2013  147 >  01/24/2014 150 >153 04/25/2014 >  09/03/2014 >144 12/04/2014   HEENT: nl dentition, turbinates, and orophanx. Nl external ear canals without cough reflex, mild turbinate edema.  Post pharynx clear, no exudate/thrush    NECK :  without JVD/Nodes/TM/ nl carotid upstrokes bilaterally   LUNGS: no acc muscle use, faint rhonchi clears with cough    CV:  RRR  no s3 or murmur or increase in P2, no edema   ABD:  soft and nontender with nl excursion in the supine position. No bruits or organomegaly, bowel sounds nl  MS:  warm without deformities, calf tenderness, cyanosis or clubbing  CXR PA and Lateral:   09/03/2014 :        Mild hyperinflation is unchanged. Negative for pneumonia. Heart size and vascularity normal. Negative for mass. Mild apical scarring bilaterally    Assessment & Plan:

## 2014-12-04 NOTE — Patient Instructions (Signed)
Flu shot today  Take Omeprazole and Nexium before meal  GERD diet  Follow med calendar closely and bring to each visit.  Follow up Dr. Sherene Sires  In 3 months and As needed   Please contact office for sooner follow up if symptoms do not improve or worsen or seek emergency care

## 2014-12-05 DIAGNOSIS — Z79899 Other long term (current) drug therapy: Secondary | ICD-10-CM | POA: Insufficient documentation

## 2014-12-05 DIAGNOSIS — K219 Gastro-esophageal reflux disease without esophagitis: Secondary | ICD-10-CM | POA: Insufficient documentation

## 2014-12-05 NOTE — Assessment & Plan Note (Signed)
Patient's medications were reviewed today and patient education was given. Computerized medication calendar was adjusted/completed  

## 2014-12-05 NOTE — Progress Notes (Signed)
Chart and office note reviewed in detail   > agree with a/p as outlined  > note pt continues to be easily confused with details of care even with use of med calendar

## 2014-12-05 NOTE — Addendum Note (Signed)
Addended by: Karalee Height on: 12/05/2014 04:40 PM   Modules accepted: Orders, Medications

## 2014-12-05 NOTE — Assessment & Plan Note (Addendum)
Compensated at present, prone to frequent flares Advised on use of mucinex and flutter valve.  Judicious use of abx if infected , 1 rx for diflucan to have on hold if yeast infection while on abx given (hold statin while taking)  Control for triggers for drainage /gerd  Patient's medications were reviewed today and patient education was given. Computerized medication calendar was adjusted/completed    Plan  Cont on current regimen  Flu shot today

## 2014-12-05 NOTE — Assessment & Plan Note (Signed)
GERD despite PPI  Correct PPI usage reviewed  For now Prilosec in am and Nexium Pm (before meal ) and Pepcid At bedtime   If on return cont w/ sx , refer to GI   Plan  Prilosec and nexium Twice daily  And pepcid At bedtime  GERD diet

## 2015-02-24 ENCOUNTER — Encounter: Payer: Self-pay | Admitting: Internal Medicine

## 2015-02-24 ENCOUNTER — Ambulatory Visit (INDEPENDENT_AMBULATORY_CARE_PROVIDER_SITE_OTHER): Payer: Medicare Other | Admitting: Internal Medicine

## 2015-02-24 VITALS — BP 104/70 | HR 91 | Ht 60.0 in | Wt 142.0 lb

## 2015-02-24 DIAGNOSIS — J4530 Mild persistent asthma, uncomplicated: Secondary | ICD-10-CM | POA: Diagnosis not present

## 2015-02-24 DIAGNOSIS — J479 Bronchiectasis, uncomplicated: Secondary | ICD-10-CM | POA: Diagnosis not present

## 2015-02-24 MED ORDER — FLUCONAZOLE 100 MG PO TABS: 100.0000 mg | ORAL_TABLET | Freq: Every day | ORAL | Status: DC

## 2015-02-24 NOTE — Progress Notes (Signed)
Subjective:    Patient ID: Caitlin BradleyWanna Newman, female    DOB: 28-Jan-1946   MRN: [id number]  Brief patient profile:  69 yowf no sign smoking hx  With lots of wheezing as child outgrew at very young age (doesn't remember exactly when) then recurrent wheezing coughing worse in spring since around 1980 [facility] until Nov 2012 with refractory cough since then so referred 08/06/2011 to pulmonary clinic by PA Ninfa LindenKathy Patterson from [place] with brochiectasis on CT 12/2011 and  nl pfts 11/01/2012    History of Present Illness  08/06/2011 1st pulmonary eval/ Wert cc persistent cough x 6 months acute onset with "flu" in November now present daily  - had been on advair maint and ACEI but the latter was stopped early May 2013 no improvement cough which is worse p eat and afternoons and cough for an hour at bedtime with sense of something stuck upper mid chest and coughs to point of choking/vomiting>  Productive of min yellow mucus. rec Stop advair, tessilon - only use albuterol (ventolin) puffer as rescue Start Pulmicort twice daily with nebulized albuterol until cough completely gone for a couple of weeks then restart the advair  Schedule sinus ct > Mild chronic sinusitis with mucosal edema in the maxillary sinus  bilaterally. Try prilosec 20mg   Take 30-60 min before first meal of the day and Pepcid 20 mg one bedtime until cough is completely gone for at least a week without the need for cough suppression GERD diet Prednisone 10 mg take  4 each am x 2 days,   2 each am x 2 days,  1 each am x2days and stop       01/24/2014 f/u ov/Wert re: bronchiectasis s sign airflow obstr by pfts 11/01/12  Chief Complaint  Patient presents with  . Follow-up    Pt states that her cough is much improved. Breathing is doing well. No new co's today. She is using proair about 2 x per day and has used neb x 1 in the past wk.   thinks flutter helping   Mucus light yellow, never bloody Not limited by  breathing from desired activities   rec See calendar for specific medication  Late add consider increase dulera to 200 if not well controlled on f/u     09/03/2014  Acute ov/Wert re: bronchiectasis  Chief Complaint  Patient presents with  . Acute Visit    Pt c/o increased SOB, cough, chest tightness x 1 wk. She is coughing up very minimal clear sputum. She is using ventolin 3 x per day and neb x 1 per day.    holding out on using tramadol until "coughs so hard she hurts " Has med calendar/ not following action plans   >>augmentin and pred   12/04/2014 NP Follow up : Bronchiectasis  Returns for 3 month follow up. Says overall she is doing okay , had flare last ov , tx w/ augmentin and prednisone taper.  She got better . Has 1 other flare since last ov, resolved with augmentin.  She is feeling good today.  Requests to have diflucan dose to use if she gets yeast infection on abx. Very prone to this .  Advised to hold statin while on this for few days .  We reviewed all her meds and organized them into a med calendar with pt education  Appears to be taking correctly , except confused with reflux meds .  Taking prilosec, nexium at bedtime and stopped pepcid.  She was recently started on nexium by PCP for reflux on prilosec.  She is suppose to be taking prilosec in am before meal and pepcid at bedtime  Says she gets reflux mainly at night when she lies down.  We discussed GERD diet and correct use of PPI/H2 blocker.  rec Take Omeprazole and Nexium before meal  GERD diet  Follow med calendar closely and bring to each visit.     02/24/2015  f/u ov/Wert re: bronchiectasis  Chief Complaint  Patient presents with  . Follow-up    Doing well, no new co's. She uses albuterol inhaler 1 x per wk on average and has not needed neb.     Not limited by breathing from desired activities  / no needing any saba or tramadol/ needs diflucan for yeast/ stopped all gerd rx   No obvious day to day or  daytime variability or assoc excess/ purulent sputum or mucus plugs   or cp or chest tightness, subjective wheeze or overt sinus or hb symptoms. No unusual exp hx or h/o childhood pna/ asthma or knowledge of premature birth.  Sleeping ok without nocturnal  or early am exacerbation  of respiratory  c/o's or need for noct saba. Also denies any obvious fluctuation of symptoms with weather or environmental changes or other aggravating or alleviating factors except as outlined above   Current Medications, Allergies, Complete Past Medical History, Past Surgical History, Family History, and Social History were reviewed in [employer] record.  ROS  The following are not active complaints unless bolded sore throat, dysphagia, dental problems, itching, sneezing,  nasal congestion or excess/ purulent secretions, ear ache R,   fever, chills, sweats, unintended wt loss, classically pleuritic or exertional cp, hemoptysis,  orthopnea pnd or leg swelling, presyncope, palpitations, abdominal pain, anorexia, nausea, vomiting, diarrhea  or change in bowel or bladder habits, change in stools or urine, dysuria,hematuria,  rash, arthralgias, visual complaints, headache, numbness, weakness or ataxia or problems with walking or coordination,  change in mood/affect or memory.                      Objective:   Physical Exam  amb wf   nad   min hoarse Wt 156 08/06/2011  > 156 12/07/2011 > 12/27/2011  155>155 01/10/12 > 02/18/2012  158 > 157 03/24/2012  > 11/01/2012   149 > 150 11/20/2012 > 154  04/25/2013 >152 05/14/2013 > 06/25/2013  148 > 10/16/2013  147 > 10/23/2013 149>150 11/13/2013 > 12/13/2013  147 > 01/24/2014 150 >153 04/25/2014 >  09/03/2014 >144 12/04/2014 > 02/24/2015 143  HEENT: nl dentition, turbinates, and orophanx. Nl external ear canals without cough reflex and retracted dull R TM/ L nl/  mild turbinate edema.  Post pharynx clear, no exudate/thrush    NECK :  without JVD/Nodes/TM/ nl carotid  upstrokes bilaterally   LUNGS: no acc muscle use, faint rhonchi clears with cough    CV:  RRR  no s3 or murmur or increase in P2, no edema   ABD:  soft and nontender with nl excursion in the supine position. No bruits or organomegaly, bowel sounds nl  MS:  warm without deformities, calf tenderness, cyanosis or clubbing       CXR PA and Lateral:   09/03/2014 :     Mild hyperinflation is unchanged. Negative for pneumonia. Heart size and vascularity normal. Negative for mass. Mild apical scarring bilaterally    Assessment & Plan:

## 2015-02-24 NOTE — Assessment & Plan Note (Signed)
-    CT chest 01/10/12 1. Bilateral bronchiectasis is most evident in the left lower lobe where ground-glass attenuation and scarring is evident. No significant airspace consolidation is present. 2. Upper lobe bronchiectasis is slightly worse on the right. No significant parenchymal disease is associated. 3. Single calcified right paratracheal node may represent treated disease or a history granulomatous disease. Alpha one AT >  MM  02/2012  PFT's wnl 11/01/2012  Prevar rx 06/25/13 and pneumovax 01/24/2014  CTa 11/21/13 > no acute changes    Adequate control on present rx, reviewed > no change in rx needed  > add diflucan prn when uses abx

## 2015-02-24 NOTE — Patient Instructions (Addendum)
Vit D should be powder, not oil  Continue pepcid at bedtime 20 mg  If not  Coughing wheezing, clearing throat or usual flare, ok to leave omeprazole  If need to take augmentin, ok to take diflucan if needed for yeast infection See ent doctor as planned for your R ear   See calendar for specific medication instructions and bring it back for each and every office visit for every healthcare provider you see.  Without it,  you may not receive the best quality medical care that we feel you deserve.  You will note that the calendar groups together  your maintenance  medications that are timed at particular times of the day.  Think of this as your checklist for what your doctor has instructed you to do until your next evaluation to see what benefit  there is  to staying on a consistent group of medications intended to keep you well.  The other group at the bottom is entirely up to you to use as you see fit  for specific symptoms that may arise between visits that require you to treat them on an as needed basis.  Think of this as your action plan or "what if" list.   Separating the top medications from the bottom group is fundamental to providing you adequate care going forward.    Please schedule a follow up visit in 6 months but call sooner if needed

## 2015-02-24 NOTE — Assessment & Plan Note (Signed)
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 - hfa 75% p coaching 11/01/2012 and can't afford pulmocort neb > rechallenged with dulera 100 2bid  -Med calendar 11/20/2012  - 02/24/2015  p extensive coaching HFA effectiveness =    90% s spacer    Adequate control on present rx, reviewed > no change in rx needed    I had an extended discussion with the patient reviewing all relevant studies completed to date and  lasting 15 to 20 minutes of a 25 minute visit    Explained the natural history of uri and why it's necessary in patients at risk to treat GERD aggressively - at least  short term -   to reduce risk of evolving cyclical cough initially  triggered by epithelial injury and a heightened sensitivty to the effects of any upper airway irritants,  most importantly acid - related - then perpetuated by epithelial injury related to the cough itself as the upper airway collapses on itself.  That is, the more sensitive the epithelium becomes once it is damaged by the virus, the more the ensuing irritability> the more the cough, the more the secondary reflux (especially in those prone to reflux) the more the irritation of the sensitive mucosa and so on in a  Classic cyclical pattern.    Discussed the recent press about ppi's in the context of a statistically significant (but questionably clinically relevant) increase in CRI in pts on ppi vs h2's > bottom line is the lowest dose of ppi that controls   gerd is the right dose and if that dose is zero that's fine esp since h2's are cheaper.  Try just the h2 hs as maint and add the ppi ac q am for any flare      Each maintenance medication was reviewed in detail including most importantly the difference between maintenance and prns and under what circumstances the prns are to be triggered using an action  plan format that is not reflected in the computer generated alphabetically organized AVS but trather by a customized med calendar that reflects the AVS meds with confirmed 100% correlation.   Please see instructions for details which were reviewed in writing and the patient given a copy highlighting the part that I personally wrote and discussed at today's ov.

## 2015-03-26 ENCOUNTER — Telehealth: Payer: Self-pay | Admitting: Internal Medicine

## 2015-03-26 NOTE — Telephone Encounter (Signed)
lmtcb x1 for pt. 

## 2015-03-26 NOTE — Telephone Encounter (Addendum)
Called and spoke with Rep at [employer], forms for PA are being faxed to be filled out by provider and need to be faxed back asap to initiate PA process for Caitlin Newman. New Ref # [id number] Will hold in triage until fax received.

## 2015-03-27 MED ORDER — ALBUTEROL SULFATE (2.5 MG/3ML) 0.083% IN NEBU: 2.5000 mg | INHALATION_SOLUTION | RESPIRATORY_TRACT | Status: DC | PRN

## 2015-03-27 NOTE — Telephone Encounter (Signed)
Received forms to be filled out for [facility]. Forms faxed back to [phone] [id number] Will await response.

## 2015-03-27 NOTE — Telephone Encounter (Signed)
Spoke with pt, states that she's changed insurance and now her albuterol neb will need a PA. Pt uses [facility].  I advised pt that we need a copy of her new insurance card.  Pt will bring this in tomorrow to be scanned in. Called pharmacy, states she does not need a PA, just needed a refill.  This has been sent.  Called pt, aware that rx was needed and has been refilled.  Nothing further needed.

## 2015-03-28 NOTE — Telephone Encounter (Signed)
PA for [facility]ulera was approved until 03/14/2016.  Pharmacy informed.

## 2015-04-14 ENCOUNTER — Telehealth: Payer: Self-pay | Admitting: Internal Medicine

## 2015-04-14 NOTE — Telephone Encounter (Signed)
Called and spoke with pt. Pt states that she has spoke with [facility] and they told her that she needs to have a PA done for her dulera. I informed her that the PA was done and approved until 03/14/16. She states she received a letter stating it was denied because they did not receive enough information to approve this drug. She states our office needs to call [phone] and give the diagnosis and the reason she is not able to take Advair. She was also told that MW needs to ask for a tier deduction when we contact [facility]. I explained to her that we would call [facility] tomorrow and once resolved we would return her call. She voiced understanding and had no further questions. Will hold message in triage.

## 2015-04-15 NOTE — Telephone Encounter (Signed)
Called [employer], states that the PA was approved but the tier exception was denied d/t lack of supporting info.  The tier exception cannot be initiated over the phone, but must be filled out and faxed back to [facility] with supporting documentation.  This form is being faxed to our office.  Will hold message to wait for fax.

## 2015-04-16 NOTE — Telephone Encounter (Signed)
I have not received a form  I called [employer] and requested that they refax this  Will await fax

## 2015-04-16 NOTE — Telephone Encounter (Signed)
Form received and placed in MW's lookat to be completed 

## 2015-04-16 NOTE — Telephone Encounter (Signed)
Called and spoke with the patient. She states that she refilled her Dulera and paid $265. She said that the [facility] should last until 04/29/15. [employer] told her that the future refills will be no more than $95. She stated that she was okay with paying $95 but would not pay $265 again. She explained that she had enough Dulera and does not prefer to switch to Symbicort but on 04/29/15 if she has not heard back from our office or the amount of the inhaler is more than $95 she will discuss switching to Symbicort. I informed her that MW had received the form and once completed will be faxed to [facility]. I insured her that once we receive a response from [facility] we would return her call. She voiced understanding and had no further questions.

## 2015-04-16 NOTE — Telephone Encounter (Signed)
See if symbicort 80 2bid will do as a substitute and let her know it's basically the same med

## 2015-05-26 ENCOUNTER — Other Ambulatory Visit: Payer: Self-pay | Admitting: Internal Medicine

## 2015-08-21 ENCOUNTER — Other Ambulatory Visit: Payer: Self-pay | Admitting: Internal Medicine

## 2015-08-25 ENCOUNTER — Ambulatory Visit: Payer: Medicare Other | Admitting: Internal Medicine

## 2015-09-01 ENCOUNTER — Encounter: Payer: Self-pay | Admitting: Internal Medicine

## 2015-09-01 ENCOUNTER — Ambulatory Visit (INDEPENDENT_AMBULATORY_CARE_PROVIDER_SITE_OTHER): Payer: Medicare Other | Admitting: Internal Medicine

## 2015-09-01 VITALS — BP 122/74 | HR 83 | Ht 61.0 in | Wt 151.2 lb

## 2015-09-01 DIAGNOSIS — J4530 Mild persistent asthma, uncomplicated: Secondary | ICD-10-CM

## 2015-09-01 DIAGNOSIS — J471 Bronchiectasis with (acute) exacerbation: Secondary | ICD-10-CM

## 2015-09-01 MED ORDER — TRAMADOL HCL 50 MG PO TABS: ORAL_TABLET | ORAL | Status: DC

## 2015-09-01 MED ORDER — AMOXICILLIN-POT CLAVULANATE 875-125 MG PO TABS: 1.0000 | ORAL_TABLET | Freq: Two times a day (BID) | ORAL | Status: DC

## 2015-09-01 NOTE — Progress Notes (Signed)
Subjective:    Patient ID: Caitlin Newman, female    DOB: 28-Jan-1946   MRN: [id number]  Brief patient profile:  69 yowf no sign smoking hx  With lots of wheezing as child outgrew at very young age (doesn't remember exactly when) then recurrent wheezing coughing worse in spring since around 1980 [facility] until Nov 2012 with refractory cough since then so referred 08/06/2011 to pulmonary clinic by PA Ninfa LindenKathy Patterson from [place] with brochiectasis on CT 12/2011 and  nl pfts 11/01/2012    History of Present Illness  08/06/2011 1st pulmonary eval/ Aleni Andrus cc persistent cough x 6 months acute onset with "flu" in November now present daily  - had been on advair maint and ACEI but the latter was stopped early May 2013 no improvement cough which is worse p eat and afternoons and cough for an hour at bedtime with sense of something stuck upper mid chest and coughs to point of choking/vomiting>  Productive of min yellow mucus. rec Stop advair, tessilon - only use albuterol (ventolin) puffer as rescue Start Pulmicort twice daily with nebulized albuterol until cough completely gone for a couple of weeks then restart the advair  Schedule sinus ct > Mild chronic sinusitis with mucosal edema in the maxillary sinus  bilaterally. Try prilosec 20mg   Take 30-60 min before first meal of the day and Pepcid 20 mg one bedtime until cough is completely gone for at least a week without the need for cough suppression GERD diet Prednisone 10 mg take  4 each am x 2 days,   2 each am x 2 days,  1 each am x2days and stop       01/24/2014 f/u ov/Daisey Caloca re: bronchiectasis s sign airflow obstr by pfts 11/01/12  Chief Complaint  Patient presents with  . Follow-up    Pt states that her cough is much improved. Breathing is doing well. No new co's today. She is using proair about 2 x per day and has used neb x 1 in the past wk.   thinks flutter helping   Mucus light yellow, never bloody Not limited by  breathing from desired activities   rec See calendar for specific medication  Late add consider increase dulera to 200 if not well controlled on f/u     09/03/2014  Acute ov/Talik Casique re: bronchiectasis  Chief Complaint  Patient presents with  . Acute Visit    Pt c/o increased SOB, cough, chest tightness x 1 wk. She is coughing up very minimal clear sputum. She is using ventolin 3 x per day and neb x 1 per day.    holding out on using tramadol until "coughs so hard she hurts " Has med calendar/ not following action plans   >>augmentin and pred   12/04/2014 NP Follow up : Bronchiectasis  Returns for 3 month follow up. Says overall she is doing okay , had flare last ov , tx w/ augmentin and prednisone taper.  She got better . Has 1 other flare since last ov, resolved with augmentin.  She is feeling good today.  Requests to have diflucan dose to use if she gets yeast infection on abx. Very prone to this .  Advised to hold statin while on this for few days .  We reviewed all her meds and organized them into a med calendar with pt education  Appears to be taking correctly , except confused with reflux meds .  Taking prilosec, nexium at bedtime and stopped pepcid.  She was recently started on nexium by PCP for reflux on prilosec.  She is suppose to be taking prilosec in am before meal and pepcid at bedtime  Says she gets reflux mainly at night when she lies down.  We discussed GERD diet and correct use of PPI/H2 blocker.  rec Take Omeprazole and Nexium before meal  GERD diet  Follow med calendar closely and bring to each visit.     02/24/2015  f/u ov/Harneet Noblett re: bronchiectasis  Chief Complaint  Patient presents with  . Follow-up    Doing well, no new co's. She uses albuterol inhaler 1 x per wk on average and has not needed neb.  rec Vit D should be powder, not oil  Continue pepcid at bedtime 20 mg  If not  Coughing wheezing, clearing throat or usual flare, ok to leave omeprazole  If  need to take augmentin, ok to take diflucan if needed for yeast infection See ent doctor as planned for your R ear  See calendar for specific medication instructions    09/01/2015  f/u ov/Delsin Copen re: asthma /bronchiectasis  - maint rx dulera 100 2bid  Chief Complaint  Patient presents with  . Follow-up    Breathing is doing well and not coughing at this time. She uses albuterol inhaler 1 x daily on average and rarely uses neb. No new co's today.   no noct events, typically needs saba in afternoons with exertion Not using med calendar       No obvious day to day or daytime variability or assoc excess/ purulent sputum or mucus plugs   or cp or chest tightness, subjective wheeze or overt sinus or hb symptoms. No unusual exp hx or h/o childhood pna/ asthma or knowledge of premature birth.  Sleeping ok without nocturnal  or early am exacerbation  of respiratory  c/o's or need for noct saba. Also denies any obvious fluctuation of symptoms with weather or environmental changes or other aggravating or alleviating factors except as outlined above   Current Medications, Allergies, Complete Past Medical History, Past Surgical History, Family History, and Social History were reviewed in [employer] [facility] electronic medical record.  ROS  The following are not active complaints unless bolded sore throat, dysphagia, dental problems, itching, sneezing,  nasal congestion or excess/ purulent secretions, ear ache R,   fever, chills, sweats, unintended wt loss, classically pleuritic or exertional cp, hemoptysis,  orthopnea pnd or leg swelling, presyncope, palpitations, abdominal pain, anorexia, nausea, vomiting, diarrhea  or change in bowel or bladder habits, change in stools or urine, dysuria,hematuria,  rash, arthralgias, visual complaints, headache, numbness, weakness or ataxia or problems with walking or coordination,  change in mood/affect or memory.                      Objective:   Physical  Exam  amb wf   nad   min hoarse Wt 156 08/06/2011  > 156 12/07/2011 > 12/27/2011  155>155 01/10/12 > 02/18/2012  158 > 157 03/24/2012  > 11/01/2012   149 > 150 11/20/2012 > 154  04/25/2013 >152 05/14/2013 > 06/25/2013  148 > 10/16/2013  147 > 10/23/2013 149>150 11/13/2013 > 12/13/2013  147 > 01/24/2014 150 >153 04/25/2014 >  09/03/2014 >144 12/04/2014 > 02/24/2015 143 >  09/01/2015   HEENT: nl dentition, turbinates, and orophanx. Nl external ear canals without cough reflex and   mild turbinate edema.  Post pharynx clear, no exudate/thrush    NECK :  without JVD/Nodes/TM/ nl  carotid upstrokes bilaterally   LUNGS: no acc muscle use, congested sounding cough on fvc/ no sign rhonchi   CV:  RRR  no s3 or murmur or increase in P2, no edema   ABD:  soft and nontender with nl excursion in the supine position. No bruits or organomegaly, bowel sounds nl  MS:  warm without deformities, calf tenderness, cyanosis or clubbing         Assessment & Plan:

## 2015-09-01 NOTE — Assessment & Plan Note (Signed)
-    CT chest 01/10/12 1. Bilateral bronchiectasis is most evident in the left lower lobe where ground-glass attenuation and scarring is evident. No significant airspace consolidation is present. 2. Upper lobe bronchiectasis is slightly worse on the right. No significant parenchymal disease is associated. 3. Single calcified right paratracheal node may represent treated disease or a history granulomatous disease. Alpha one AT >  MM  02/2012  PFT's wnl 11/01/2012  Prevar rx 06/25/13 and pneumovax 01/24/2014  CTa 11/21/13 > no acute changes     Probably ok to leave off the pm dose of ppi    Each maintenance medication was reviewed in detail including most importantly the difference between maintenance and as needed and under what circumstances the prns are to be used. This was done in the context of a medication calendar review which provided the patient with a user-friendly unambiguous mechanism for medication administration and reconciliation and provides an action plan for all active problems. It is critical that this be shown to every doctor  for modification during the office visit if necessary so the patient can use it as a working document.

## 2015-09-01 NOTE — Patient Instructions (Signed)
Stop the pm dose of nexium  See calendar for specific medication instructions and bring it back for each and every office visit for every healthcare provider you see.  Without it,  you may not receive the best quality medical care that we feel you deserve.  You will note that the calendar groups together  your maintenance  medications that are timed at particular times of the day.  Think of this as your checklist for what your doctor has instructed you to do until your next evaluation to see what benefit  there is  to staying on a consistent group of medications intended to keep you well.  The other group at the bottom is entirely up to you to use as you see fit  for specific symptoms that may arise between visits that require you to treat them on an as needed basis.  Think of this as your action plan or "what if" list.   Separating the top medications from the bottom group is fundamental to providing you adequate care going forward.    Please schedule a follow up visit in 3 months but call sooner if needed

## 2015-09-01 NOTE — Assessment & Plan Note (Signed)
-  D/c advair 10/18/2011 due to cough   - HFA 10% baseline 02/18/2012 > changed to neb laba/ics and d/c singulair since flared on it   - Allergy Profile 12/07/11 >  IgE 89 but no specific allergen identified - hfa 75% p coaching 11/01/2012 and can't afford pulmocort neb > rechallenged with dulera 100 2bid  -Med calendar 11/20/2012  - 02/24/2015  p extensive coaching HFA effectiveness =    90% s spacer    All goals of chronic asthma control met including optimal function and elimination of symptoms with minimal need for rescue therapy.  Contingencies discussed in full including contacting this office immediately if not controlling the symptoms using the rule of two's.     No need to change rx

## 2015-11-25 ENCOUNTER — Other Ambulatory Visit: Payer: Self-pay | Admitting: Internal Medicine

## 2015-12-02 ENCOUNTER — Other Ambulatory Visit (INDEPENDENT_AMBULATORY_CARE_PROVIDER_SITE_OTHER): Payer: Medicare Other

## 2015-12-02 ENCOUNTER — Ambulatory Visit (INDEPENDENT_AMBULATORY_CARE_PROVIDER_SITE_OTHER): Payer: Medicare Other | Admitting: Internal Medicine

## 2015-12-02 ENCOUNTER — Encounter: Payer: Self-pay | Admitting: Internal Medicine

## 2015-12-02 VITALS — BP 114/76 | HR 75 | Ht 61.0 in | Wt 153.0 lb

## 2015-12-02 DIAGNOSIS — J4530 Mild persistent asthma, uncomplicated: Secondary | ICD-10-CM

## 2015-12-02 DIAGNOSIS — R05 Cough: Secondary | ICD-10-CM

## 2015-12-02 DIAGNOSIS — R059 Cough, unspecified: Secondary | ICD-10-CM

## 2015-12-02 DIAGNOSIS — Z23 Encounter for immunization: Secondary | ICD-10-CM | POA: Diagnosis not present

## 2015-12-02 DIAGNOSIS — J479 Bronchiectasis, uncomplicated: Secondary | ICD-10-CM | POA: Diagnosis not present

## 2015-12-02 LAB — CBC WITH DIFFERENTIAL/PLATELET
BASOS ABS: 0 10*3/uL (ref 0.0–0.1)
Basophils Relative: 0.5 % (ref 0.0–3.0)
EOS ABS: 0.3 10*3/uL (ref 0.0–0.7)
Eosinophils Relative: 4.3 % (ref 0.0–5.0)
HEMATOCRIT: 40.6 % (ref 36.0–46.0)
Hemoglobin: 13.9 g/dL (ref 12.0–15.0)
LYMPHS PCT: 30.1 % (ref 12.0–46.0)
Lymphs Abs: 2.4 10*3/uL (ref 0.7–4.0)
MCHC: 34.2 g/dL (ref 30.0–36.0)
MCV: 85.1 fl (ref 78.0–100.0)
MONOS PCT: 6.9 % (ref 3.0–12.0)
Monocytes Absolute: 0.5 10*3/uL (ref 0.1–1.0)
Neutro Abs: 4.6 10*3/uL (ref 1.4–7.7)
Neutrophils Relative %: 58.2 % (ref 43.0–77.0)
Platelets: 258 10*3/uL (ref 150.0–400.0)
RBC: 4.77 Mil/uL (ref 3.87–5.11)
RDW: 13.9 % (ref 11.5–15.5)
WBC: 7.9 10*3/uL (ref 4.0–10.5)

## 2015-12-02 LAB — NITRIC OXIDE: Nitric Oxide: 15

## 2015-12-02 NOTE — Patient Instructions (Addendum)
Please remember to go to the lab department downstairs for your tests - we will call you with the results when they are available.  Work on inhaler technique:  relax and gently blow all the way out then take a nice smooth deep breath back in, triggering the inhaler at same time you start breathing in.  Hold for up to 5 seconds if you can. Blow out thru nose. Rinse and gargle with water when done     Goal is to eliminate the airway trauma by using more mucinex dm and flutter as per your med calendar    See calendar for specific medication instructions and bring it back for each and every office visit for every healthcare provider you see.  Without it,  you may not receive the best quality medical care that we feel you deserve.  You will note that the calendar groups together  your maintenance  medications that are timed at particular times of the day.  Think of this as your checklist for what your doctor has instructed you to do until your next evaluation to see what benefit  there is  to staying on a consistent group of medications intended to keep you well.  The other group at the bottom is entirely up to you to use as you see fit  for specific symptoms that may arise between visits that require you to treat them on an as needed basis.  Think of this as your action plan or "what if" list.   Separating the top medications from the bottom group is fundamental to providing you adequate care going forward.   Please schedule a follow up visit in 3 months but call sooner if needed  With pfts on return

## 2015-12-02 NOTE — Progress Notes (Signed)
Subjective:    Patient ID: Caitlin Newman, female    DOB: December 04, 1945   MRN: [id number]  Brief patient profile:  70  yowf no sign smoking hx  With lots of wheezing as child outgrew at very young age (doesn't remember exactly when) then recurrent wheezing coughing worse in spring since around 1980 [facility] until Nov 2012 with refractory cough since then so referred 08/06/2011 to pulmonary clinic by PA Ninfa [facility] [place] from [place] with brochiectasis on CT 12/2011 and  nl pfts 11/01/2012    History of Present Illness  08/06/2011 1st pulmonary eval/ Adil[facility] G[facility]on cc persistent cough x 6 months acute onset with "flu" in November now present daily  - had been on advair maint and ACEI but the latter was stopped early May 2013 no improvement cough which is worse p eat and afternoons and cough for an hour at bedtime with sense of something stuck upper mid chest and coughs to point of choking/vomiting>  Productive of min yellow mucus. rec Stop advair, tessilon - only use albuterol (ventolin) puffer as rescue Start Pulmicort twice daily with nebulized albuterol until cough completely gone for a couple of weeks then restart the advair  Schedule sinus ct > Mild chronic sinusitis with mucosal edema in the maxillary sinus  bilaterally. Try prilosec 20mg   Take 30-60 min before first meal of the day and Pepcid 20 mg one bedtime until cough is completely gone for at least a week without the need for cough suppression GERD diet Prednisone 10 mg take  4 each am x 2 days,   2 each am x 2 days,  1 each am x2days and stop       01/24/2014 f/u ov/Caitlin Newman re: bronchiectasis s sign airflow obstr by pfts 11/01/12  Chief Complaint  Patient presents with  . Follow-up    Pt states that her cough is much improved. Breathing is doing well. No new co's today. She is using proair about 2 x per day and has used neb x 1 in the past wk.   thinks flutter helping   Mucus light yellow, never bloody Not limited by  breathing from desired activities   rec See calendar for specific medication  Late add consider increase dulera to 200 if not well controlled on f/u     09/03/2014  Acute ov/Caitlin Newman re: bronchiectasis  Chief Complaint  Patient presents with  . Acute Visit    Pt c/o increased SOB, cough, chest tightness x 1 wk. She is coughing up very minimal clear sputum. She is using ventolin 3 x per day and neb x 1 per day.    holding out on using tramadol until "coughs so hard she hurts " Has med calendar/ not following action plans   >>augmentin and pred   12/04/2014 NP Follow up : Bronchiectasis  Returns for 3 month follow up. Says overall she is doing okay , had flare last ov , tx w/ augmentin and prednisone taper.  She got better . Has 1 other flare since last ov, resolved with augmentin.  She is feeling good today.  Requests to have diflucan dose to use if she gets yeast infection on abx. Very prone to this .  Advised to hold statin while on this for few days .  We reviewed all her meds and organized them into a med calendar with pt education  Appears to be taking correctly , except confused with reflux meds .  Taking prilosec, nexium at bedtime and stopped  pepcid.  She was recently started on nexium by PCP for reflux on prilosec.  She is suppose to be taking prilosec in am before meal and pepcid at bedtime  Says she gets reflux mainly at night when she lies down.  We discussed GERD diet and correct use of PPI/H2 blocker.  rec Take Omeprazole and Nexium before meal  GERD diet  Follow med calendar closely and bring to each visit.     02/24/2015  f/u ov/Caitlin Newman re: bronchiectasis  Chief Complaint  Patient presents with  . Follow-up    Doing well, no new co's. She uses albuterol inhaler 1 x per wk on average and has not needed neb.  rec Vit D should be powder, not oil  Continue pepcid at bedtime 20 mg  If not  Coughing wheezing, clearing throat or usual flare, ok to leave omeprazole  If  need to take augmentin, ok to take diflucan if needed for yeast infection See ent doctor as planned for your R ear  See calendar for specific medication instructions    09/01/2015  f/u ov/Caitlin Newman re: asthma /bronchiectasis  - maint rx dulera 100 2bid  Chief Complaint  Patient presents with  . Follow-up    Breathing is doing well and not coughing at this time. She uses albuterol inhaler 1 x daily on average and rarely uses neb. No new co's today.   no noct events, typically needs saba in afternoons with exertion Not using med calendar  rec Stop the pm dose of nexium See calendar for specific medication instructions    12/02/2015  f/u ov/Caitlin Newman re: asthma/ bronchiectasis  maint on dulera 100 2bid  Chief Complaint  Patient presents with  . Follow-up    Cough is unchanged. She denies any new co's today. She is using ventolin 2 x daily on average.    some watery pnd/ mild flare with fall weather despite neg RAST previously Not limited by breathing from desired activities      No obvious day to day or daytime variability or assoc excess/ purulent sputum or mucus plugs or hemoptysis  or cp or chest tightness, subjective wheeze or overt sinus or hb symptoms. No unusual exp hx or h/o childhood pna/ asthma or knowledge of premature birth.  Sleeping ok without nocturnal  or early am exacerbation  of respiratory  c/o's or need for noct saba. Also denies any obvious fluctuation of symptoms with weather or environmental changes or other aggravating or alleviating factors except as outlined above   Current Medications, Allergies, Complete Past Medical History, Past Surgical History, Family History, and Social History were reviewed in [employer] record.  ROS  The following are not active complaints unless bolded sore throat, dysphagia, dental problems, itching, sneezing,  nasal congestion or excess/ purulent secretions, ear ache,    fever, chills, sweats, unintended wt loss,  classically pleuritic or exertional cp, hemoptysis,  orthopnea pnd or leg swelling, presyncope, palpitations, abdominal pain, anorexia, nausea, vomiting, diarrhea  or change in bowel or bladder habits, change in stools or urine, dysuria,hematuria,  rash, arthralgias, visual complaints, headache, numbness, weakness or ataxia or problems with walking or coordination,  change in mood/affect or memory.                      Objective:   Physical Exam  amb wf   nad   min hoarse/ harsh barkng cough s flutter in hand  Vital signs reviewed  Wt 156 08/06/2011  >  156 12/07/2011 > 12/27/2011  155>155 01/10/12 > 02/18/2012  158 > 157 03/24/2012  > 11/01/2012   149 > 150 11/20/2012 > 154  04/25/2013 >152 05/14/2013 > 06/25/2013  148 > 10/16/2013  147 > 10/23/2013 149>150 11/13/2013 > 12/13/2013  147 > 01/24/2014 150 >153 04/25/2014 >  09/03/2014 >144 12/04/2014 > 02/24/2015 143 >  09/01/2015   HEENT: nl dentition, turbinates, and orophanx. Nl external ear canals without cough reflex and   mild turbinate edema.  Post pharynx clear, no exudate/thrush    NECK :  without JVD/Nodes/TM/ nl carotid upstrokes bilaterally   LUNGS: no acc muscle use, congested sounding cough on fvc/ no sign rhonchi  But end exp cough   CV:  RRR  no s3 or murmur or increase in P2, no edema   ABD:  soft and nontender with nl excursion in the supine position. No bruits or organomegaly, bowel sounds nl  MS:  warm without deformities, calf tenderness, cyanosis or clubbing    Labs 12/02/2015 = allergy profile         Assessment & Plan:

## 2015-12-02 NOTE — Assessment & Plan Note (Signed)
-    CT chest 01/10/12 1. Bilateral bronchiectasis is most evident in the left lower lobe where ground-glass attenuation and scarring is evident. No significant airspace consolidation is present. 2. Upper lobe bronchiectasis is slightly worse on the right. No significant parenchymal disease is associated. 3. Single calcified right paratracheal node may represent treated disease or a history granulomatous disease. Alpha one AT >  MM  02/2012  PFT's wnl 11/01/2012  Prevar rx 06/25/13 and pneumovax 01/24/2014  CTa 11/21/13 > no acute changes    Controlled on present rx, no recent abx   I had an extended discussion with the patient reviewing all relevant studies completed to date and  lasting 15 to 20 minutes of a 25 minute visit    Each maintenance medication was reviewed in detail including most importantly the difference between maintenance and prns and under what circumstances the prns are to be triggered using an action plan format that is not reflected in the computer generated alphabetically organized AVS but trather by a customized med calendar that reflects the AVS meds with confirmed 100% correlation.   Please see instructions for details which were reviewed in writing and the patient given a copy highlighting the part that I personally wrote and discussed at today's ov.

## 2015-12-02 NOTE — Assessment & Plan Note (Signed)
-  D/c advair 10/18/2011 due to cough   - HFA 10% baseline 02/18/2012 > changed to neb laba/ics and d/c singulair since flared on it   - Allergy Profile 12/07/11 >  IgE 89 but no specific allergen identified - hfa 75% p coaching 11/01/2012 and can't afford pulmocort neb > rechallenged with dulera 100 2bid  -Med calendar 11/20/2012    - FENO 12/02/2015  =   15 on dulera 100 2bid  - 12/02/2015  After extensive coaching HFA effectiveness =    75% (Ti too short)    Despite coughing fits, every indication is that All goals of chronic asthma control met including optimal function and elimination of symptoms with minimal need for rescue therapy.  Contingencies discussed in full including contacting this office immediately if not controlling the symptoms using the rule of two's.

## 2015-12-02 NOTE — Assessment & Plan Note (Signed)
-   Sinus CT 11/21/2013 > Mild mucosal thickening inferior right maxillary sinus.   - FENO 12/02/2015  =   15 - Allergy profile 12/02/2015 >  Eos 0. /  IgE    Seems diproportionate to dx's of bronchiectasis /asthma with upper airway component > rec flutter/ allergy eval next

## 2015-12-03 LAB — RESPIRATORY ALLERGY PROFILE REGION II ~~LOC~~
Allergen, Comm Silver Birch, t9: <0.1 kU/L
Allergen, D pternoyssinus,d7: <0.1 kU/L
Allergen, Oak,t7: <0.1 kU/L
Box Elder IgE: <0.1 kU/L
Cat Dander: <0.1 kU/L
D. farinae: <0.1 kU/L
Dog Dander: <0.1 kU/L
Elm IgE: <0.1 kU/L
IgE (Immunoglobulin E), Serum: 246 kU/L — ABNORMAL HIGH (ref <115)
Sheep Sorrel IgE: <0.1 kU/L

## 2015-12-03 NOTE — Progress Notes (Signed)
LMTCB

## 2015-12-04 NOTE — Progress Notes (Signed)
Spoke with pt and notified of results per Dr. Wert. Pt verbalized understanding and denied any questions. 

## 2015-12-08 ENCOUNTER — Telehealth: Payer: Self-pay | Admitting: Internal Medicine

## 2015-12-08 MED ORDER — ALBUTEROL SULFATE HFA 108 (90 BASE) MCG/ACT IN AERS: INHALATION_SPRAY | RESPIRATORY_TRACT | 5 refills | Status: DC

## 2015-12-08 NOTE — Telephone Encounter (Signed)
Spoke with pt. [facility] This has been done. Nothing further was needed.

## 2015-12-24 ENCOUNTER — Telehealth: Payer: Self-pay | Admitting: Internal Medicine

## 2015-12-24 MED ORDER — MOMETASONE FURO-FORMOTEROL FUM 100-5 MCG/ACT IN AERO: INHALATION_SPRAY | RESPIRATORY_TRACT | 6 refills | Status: DC

## 2015-12-24 NOTE — Telephone Encounter (Signed)
Called and spoke with pt and she is aware of refill that has been sent to the pharmacy.  Nothing further is needed.  

## 2016-03-01 ENCOUNTER — Other Ambulatory Visit: Payer: Self-pay | Admitting: Internal Medicine

## 2016-03-01 DIAGNOSIS — J4530 Mild persistent asthma, uncomplicated: Secondary | ICD-10-CM

## 2016-03-02 ENCOUNTER — Encounter: Payer: Self-pay | Admitting: Internal Medicine

## 2016-03-02 ENCOUNTER — Ambulatory Visit (INDEPENDENT_AMBULATORY_CARE_PROVIDER_SITE_OTHER): Payer: Medicare Other | Admitting: Internal Medicine

## 2016-03-02 VITALS — BP 132/76 | HR 85 | Ht 60.0 in | Wt 155.0 lb

## 2016-03-02 DIAGNOSIS — J4530 Mild persistent asthma, uncomplicated: Secondary | ICD-10-CM

## 2016-03-02 DIAGNOSIS — J479 Bronchiectasis, uncomplicated: Secondary | ICD-10-CM | POA: Diagnosis not present

## 2016-03-02 DIAGNOSIS — R05 Cough: Secondary | ICD-10-CM | POA: Diagnosis not present

## 2016-03-02 DIAGNOSIS — R059 Cough, unspecified: Secondary | ICD-10-CM

## 2016-03-02 LAB — PULMONARY FUNCTION TEST
DL/VA % PRED: 98 %
DL/VA: 4.19 ml/min/mmHg/L
DLCO COR: 16.86 ml/min/mmHg
DLCO UNC % PRED: 88 %
DLCO cor % pred: 89 %
DLCO unc: 16.65 ml/min/mmHg
FEF 25-75 Post: 2.14 L/sec
FEF 25-75 Pre: 1.79 L/sec
FEF2575-%CHANGE-POST: 19 %
FEF2575-%PRED-POST: 122 %
FEF2575-%PRED-PRE: 102 %
FEV1-%CHANGE-POST: 4 %
FEV1-%PRED-PRE: 100 %
FEV1-%Pred-Post: 104 %
FEV1-Post: 2.03 L
FEV1-Pre: 1.95 L
FEV1FVC-%CHANGE-POST: 3 %
FEV1FVC-%PRED-PRE: 108 %
FEV6-%Change-Post: 0 %
FEV6-%Pred-Post: 96 %
FEV6-%Pred-Pre: 95 %
FEV6-PRE: 2.36 L
FEV6-Post: 2.36 L
FEV6FVC-%PRED-PRE: 105 %
FEV6FVC-%Pred-Post: 105 %
FVC-%Change-Post: 0 %
FVC-%PRED-POST: 91 %
FVC-%Pred-Pre: 91 %
FVC-Post: 2.37 L
FVC-Pre: 2.36 L
POST FEV1/FVC RATIO: 86 %
PRE FEV6/FVC RATIO: 100 %
Post FEV6/FVC ratio: 100 %
Pre FEV1/FVC ratio: 83 %
RV % pred: 85 %
RV: 1.67 L
TLC % pred: 95 %
TLC: 4.24 L

## 2016-03-02 NOTE — Progress Notes (Signed)
Subjective:    Patient ID: Caitlin Newman, female    DOB: 08-09-1945   MRN: [id number]  Brief patient profile:  70  yowf no sign smoking hx  With lots of wheezing as child outgrew at very young age (doesn't remember exactly when) then recurrent wheezing coughing worse in spring since around 1980 [facility] until Nov 2012 with refractory cough since then so referred 08/06/2011 to pulmonary clinic by PA [facility] from [place] with brochiectasis on CT 12/2011 and  nl pfts 11/01/2012    History of Present Illness  08/06/2011 1st pulmonary eval/ Caitlin Newman cc persistent cough x 6 months acute onset with "flu" in November now present daily  - had been on advair maint and ACEI but the latter was stopped early May 2013 no improvement cough which is worse p eat and afternoons and cough for an hour at bedtime with sense of something stuck upper mid chest and coughs to point of choking/vomiting>  Productive of min yellow mucus. rec Stop advair, tessilon - only use albuterol (ventolin) puffer as rescue Start Pulmicort twice daily with nebulized albuterol until cough completely gone for a couple of weeks then restart the advair  Schedule sinus ct > Mild chronic sinusitis with mucosal edema in the maxillary sinus  bilaterally. Try prilosec 20mg   Take 30-60 min before first meal of the day and Pepcid 20 mg one bedtime until cough is completely gone for at least a week without the need for cough suppression GERD diet Prednisone 10 mg take  4 each am x 2 days,   2 each am x 2 days,  1 each am x2days and stop       01/24/2014 f/u ov/Caitlin Newman re: bronchiectasis s sign airflow obstr by pfts 11/01/12  Chief Complaint  Patient presents with  . Follow-up    Pt states that her cough is much improved. Breathing is doing well. No new co's today. She is using proair about 2 x per day and has used neb x 1 in the past wk.   thinks flutter helping   Mucus light yellow, never bloody Not limited by  breathing from desired activities   rec See calendar for specific medication  Late add consider increase dulera to 200 if not well controlled on f/u     09/03/2014  Acute ov/Caitlin Newman re: bronchiectasis  Chief Complaint  Patient presents with  . Acute Visit    Pt c/o increased SOB, cough, chest tightness x 1 wk. She is coughing up very minimal clear sputum. She is using ventolin 3 x per day and neb x 1 per day.    holding out on using tramadol until "coughs so hard she hurts " Has med calendar/ not following action plans   >>augmentin and pred   12/04/2014 NP Follow up : Bronchiectasis  Returns for 3 month follow up. Says overall she is doing okay , had flare last ov , tx w/ augmentin and prednisone taper.  She got better . Has 1 other flare since last ov, resolved with augmentin.  She is feeling good today.  Requests to have diflucan dose to use if she gets yeast infection on abx. Very prone to this .  Advised to hold statin while on this for few days .  We reviewed all her meds and organized them into a med calendar with pt education  Appears to be taking correctly , except confused with reflux meds .  Taking prilosec, nexium at bedtime and stopped  pepcid.  She was recently started on nexium by PCP for reflux on prilosec.  She is suppose to be taking prilosec in am before meal and pepcid at bedtime  Says she gets reflux mainly at night when she lies down.  We discussed GERD diet and correct use of PPI/H2 blocker.  rec Take Omeprazole and Nexium before meal  GERD diet  Follow med calendar closely and bring to each visit.     02/24/2015  f/u ov/Caitlin Newman re: bronchiectasis  Chief Complaint  Patient presents with  . Follow-up    Doing well, no new co's. She uses albuterol inhaler 1 x per wk on average and has not needed neb.  rec Vit D should be powder, not oil  Continue pepcid at bedtime 20 mg  If not  Coughing wheezing, clearing throat or usual flare, ok to leave omeprazole  If  need to take augmentin, ok to take diflucan if needed for yeast infection See ent doctor as planned for your R ear  See calendar for specific medication instructions    09/01/2015  f/u ov/Caitlin Newman re: asthma /bronchiectasis  - maint rx dulera 100 2bid  Chief Complaint  Patient presents with  . Follow-up    Breathing is doing well and not coughing at this time. She uses albuterol inhaler 1 x daily on average and rarely uses neb. No new co's today.   no noct events, typically needs saba in afternoons with exertion Not using med calendar  rec Stop the pm dose of nexium See calendar for specific medication instructions    12/02/2015  f/u ov/Caitlin Newman re: asthma/ bronchiectasis  maint on dulera 100 2bid  Chief Complaint  Patient presents with  . Follow-up    Cough is unchanged. She denies any new co's today. She is using ventolin 2 x daily on average.    some watery pnd/ mild flare with fall weather despite neg RAST previously Not limited by breathing from desired activities   rec Please remember to go to the lab department downstairs for your tests - we will call you with the results when they are available. Work on inhaler technique:  relax and gently blow all the way out then take a nice smooth deep breath back in, triggering the inhaler at same time you start breathing in.  Hold for up to 5 seconds if you can. Blow out thru nose. Rinse and gargle with water when done Goal is to eliminate the airway trauma by using more mucinex dm and flutter as per your med calendar     03/02/2016  f/u ov/Caitlin Newman re: asthma/ bronchiectasis with nl pfts after am dulera 100 2bid/ Chief Complaint  Patient presents with  . Follow-up    PFT's done today. Breathing is doing well and no new co's.   main problem is steps cause doe/ ok flat in fact:  Doing treadmill avg daily x 20 min s stopping x 2.7 mph /0 grade  augmentin x qo month at most for flares  No obvious day to day or daytime variability or assoc  excess/ purulent sputum or mucus plugs or hemoptysis  or cp or chest tightness, subjective wheeze or overt   hb symptoms. No unusual exp hx or h/o childhood pna/ asthma or knowledge of premature birth.  Sleeping ok without nocturnal  or early am exacerbation  of respiratory  c/o's or need for noct saba. Also denies any obvious fluctuation of symptoms with weather or environmental changes or other aggravating or alleviating factors except  as outlined above   Current Medications, Allergies, Complete Past Medical History, Past Surgical History, Family History, and Social History were reviewed in [employer] record.  ROS  The following are not active complaints unless bolded sore throat, dysphagia, dental problems, itching, sneezing,  nasal congestion or excess/ purulent secretions, ear ache,    fever, chills, sweats, unintended wt loss, classically pleuritic or exertional cp, hemoptysis,  orthopnea pnd or leg swelling, presyncope, palpitations, abdominal pain, anorexia, nausea, vomiting, diarrhea  or change in bowel or bladder habits, change in stools or urine, dysuria,hematuria,  rash, arthralgias, visual complaints, headache, numbness, weakness or ataxia or problems with walking or coordination,  change in mood/affect or memory.              Objective:   Physical Exam  amb wf   nad  / nasal tone to voice but all smiles today   Vital signs reviewed - Note on arrival 02 sats  99% on RA    Wt 156 08/06/2011  > 156 12/07/2011 > 12/27/2011  155>155 01/10/12 > 02/18/2012  158 > 157 03/24/2012  > 11/01/2012   149 > 150 11/20/2012 > 154  04/25/2013 >152 05/14/2013 > 06/25/2013  148 > 10/16/2013  147 > 10/23/2013 149>150 11/13/2013 > 12/13/2013  147 > 01/24/2014 150 >153 04/25/2014 >  09/03/2014 >144 12/04/2014 > 02/24/2015 143 >  09/01/2015   HEENT: nl dentition, turbinates, and orophanx. Nl external ear canals without cough reflex and   Moderate bilateral non-specific  turbinate edema.  Post  pharynx clear, no exudate/thrush    NECK :  without JVD/Nodes/TM/ nl carotid upstrokes bilaterally   LUNGS: no acc muscle use,  Minimal insp and exp rhonchi bilaterally   CV:  RRR  no s3 or murmur or increase in P2, no edema   ABD:  soft and nontender with nl excursion in the supine position. No bruits or organomegaly, bowel sounds nl  MS:  warm without deformities, calf tenderness, cyanosis or clubbing            Assessment & Plan:

## 2016-03-02 NOTE — Patient Instructions (Addendum)
Work toward 30 min of treadmill daily where you are short of breath but never out of breath by changing the speed first the highest you are comfortable with and then the tilt   Consider returning to your sinus Doctor   Please schedule a follow up visit in 6  months but call sooner if needed

## 2016-03-03 ENCOUNTER — Encounter: Payer: Self-pay | Admitting: Internal Medicine

## 2016-03-03 NOTE — Assessment & Plan Note (Signed)
-   Sinus CT 11/21/2013 > Mild mucosal thickening inferior right maxillary sinus.   - FENO 12/02/2015  =   15 - Allergy profile 12/02/2015 >  Eos 0.3 /  IgE  246  Neg RAST  Nothing else to add here but I reminded her that should she develop more active sinus symptoms that she should see her ENT asap  for follow-up as gravity will work against her in terms of contaminating the lower airway when she has active upper airway infection. especially in the setting of poor pulmonary clearance mechanisms intrinsically due to bronchiectasis..Marland Kitchen

## 2016-03-03 NOTE — Assessment & Plan Note (Signed)
-    CT chest 01/10/12 1. Bilateral bronchiectasis is most evident in the left lower lobe where ground-glass attenuation and scarring is evident. No significant airspace consolidation is present. 2. Upper lobe bronchiectasis is slightly worse on the right. No significant parenchymal disease is associated. 3. Single calcified right paratracheal node may represent treated disease or a history granulomatous disease. Alpha one AT >  MM  02/2012  PFT's wnl 11/01/2012  Prevar rx 06/25/13 and pneumovax 01/24/2014  CTa 11/21/13 > no acute changes  - PFT's  03/02/2016  FEV1 2.03 (104 % ) ratio 86  p 4 % improvement from saba p dulera 100 x2   prior to study with DLCO  88/89c % corrects to 98 % for alv volume     I had an extended discussion with the patient reviewing all relevant studies completed to date and  lasting 15 to 20 minutes of a 25 minute visit on the following ongoing concerns:   1) bronchiectasis as well controlled as possible. She will always cough to some extent and the only way to control it is to use the flutter valve more consistently to help her bring up mucus and the short courses of Augmentin to eliminate any acute bacterial infection.  2) this leaves unexplained her complaint of dyspnea on exertion. I suspect this is deconditioning noting that her main problem is going up steps, not walking on a flat surface, which I encouraged her to build up as tolerated.  3)  Each maintenance medication was reviewed in detail including most importantly the difference between maintenance and as needed and under what circumstances the prns are to be used. This was done in the context of a medication calendar review which provided the patient with a user-friendly unambiguous mechanism for medication administration and reconciliation and provides an action plan for all active problems. It is critical that this be shown to every doctor  for modification during the office visit if necessary so the patient  can use it as a working document.

## 2016-03-03 NOTE — Assessment & Plan Note (Signed)
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  Contingencies discussed in full including contacting this office immediately if not controlling the symptoms using the rule of two's.

## 2016-07-27 ENCOUNTER — Other Ambulatory Visit: Payer: Self-pay | Admitting: Internal Medicine

## 2016-08-30 ENCOUNTER — Other Ambulatory Visit: Payer: Self-pay | Admitting: Internal Medicine

## 2016-08-31 ENCOUNTER — Ambulatory Visit (INDEPENDENT_AMBULATORY_CARE_PROVIDER_SITE_OTHER): Payer: Medicare Other | Admitting: Internal Medicine

## 2016-08-31 ENCOUNTER — Encounter: Payer: Self-pay | Admitting: Internal Medicine

## 2016-08-31 VITALS — BP 152/80 | HR 59 | Ht 60.0 in | Wt 151.2 lb

## 2016-08-31 DIAGNOSIS — J4530 Mild persistent asthma, uncomplicated: Secondary | ICD-10-CM

## 2016-08-31 DIAGNOSIS — J471 Bronchiectasis with (acute) exacerbation: Secondary | ICD-10-CM | POA: Diagnosis not present

## 2016-08-31 MED ORDER — AMOXICILLIN-POT CLAVULANATE 875-125 MG PO TABS: 1.0000 | ORAL_TABLET | Freq: Two times a day (BID) | ORAL | 11 refills | Status: DC

## 2016-08-31 MED ORDER — FLUCONAZOLE 100 MG PO TABS: 100.0000 mg | ORAL_TABLET | Freq: Every day | ORAL | 11 refills | Status: DC

## 2016-08-31 NOTE — Assessment & Plan Note (Signed)
-    CT chest 01/10/12 1. Bilateral bronchiectasis is most evident in the left lower lobe where ground-glass attenuation and scarring is evident. No significant airspace consolidation is present. 2. Upper lobe bronchiectasis is slightly worse on the right. No significant parenchymal disease is associated. 3. Single calcified right paratracheal node may represent treated disease or a history granulomatous disease. Alpha one AT >  MM  02/2012  PFT's wnl 11/01/2012  Prevar rx 06/25/13 and pneumovax 01/24/2014  CTa 11/21/13 > no acute changes  - PFT's  03/02/2016  FEV1 2.03 (104 % ) ratio 86  p 4 % improvement from saba p dulera 100 x2   prior to study with DLCO  88/89c % corrects to 98 % for alv volume     Adequate control on present rx, reviewed in detail with pt > no change in rx needed  = augmentin x 10 d prn

## 2016-08-31 NOTE — Assessment & Plan Note (Signed)
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 - hfa 75% p coaching 11/01/2012 and can't afford pulmocort neb > rechallenged with dulera 100 2bid  -Med calendar 11/20/2012    - FENO 12/02/2015  =   15 on dulera 100 2bid   - 08/31/2016  After extensive coaching HFA effectiveness =    90%   Adequate control on present rx, reviewed in detail with pt > no change in rx needed    I had an extended discussion with the patient reviewing all relevant studies completed to date and  lasting 10 min of a 15 minute visit    Each maintenance medication was reviewed in detail including most importantly the difference between maintenance and prns and under what circumstances the prns are to be triggered using an action plan format that is not reflected in the computer generated alphabetically organized AVS but trather by a customized med calendar that reflects the AVS meds with confirmed 100% correlation.   In addition, Please see AVS for unique instructions that I personally wrote and verbalized to the the pt in detail and then reviewed with pt  by my nurse highlighting any  changes in therapy recommended at today's visit to their plan of care.

## 2016-08-31 NOTE — Progress Notes (Signed)
Subjective:    Patient ID: Caitlin Newman, female    DOB: 1945-06-18   MRN: [id number]  Brief patient profile:  70  yowf no sign smoking hx  With lots of wheezing as child outgrew at very young age (doesn't remember exactly when) then recurrent wheezing coughing worse in spring since around 1980 [name] until Nov 2012 with refractory cough since then so referred 08/06/2011 to pulmonary clinic by PA Ninfa [place]athy [place] from [place] with brochiectasis on CT 12/2011 and  nl pfts 11/01/2012    History of Present Illness  08/06/2011 1st pulmonary eval/ Caitlin Newman cc persistent cough x 6 months acute onset with "flu" in November now present daily  - had been on advair maint and ACEI but the latter was stopped early May 2013 no improvement cough which is worse p eat and afternoons and cough for an hour at bedtime with sense of something stuck upper mid chest and coughs to point of choking/vomiting>  Productive of min yellow mucus. rec Stop advair, tessilon - only use albuterol (ventolin) puffer as rescue Start Pulmicort twice daily with nebulized albuterol until cough completely gone for a couple of weeks then restart the advair  Schedule sinus ct > Mild chronic sinusitis with mucosal edema in the maxillary sinus  bilaterally. Try prilosec 20mg   Take 30-60 min before first meal of the day and Pepcid 20 mg one bedtime until cough is completely gone for at least a week without the need for cough suppression GERD diet Prednisone 10 mg take  4 each am x 2 days,   2 each am x 2 days,  1 each am x2days and stop       01/24/2014 f/u ov/Caitlin Newman re: bronchiectasis s sign airflow obstr by pfts 11/01/12  Chief Complaint  Patient presents with  . Follow-up    Pt states that her cough is much improved. Breathing is doing well. No new co's today. She is using proair about 2 x per day and has used neb x 1 in the past wk.   thinks flutter helping   Mucus light yellow, never bloody Not limited by  breathing from desired activities   rec See calendar for specific medication  Late add consider increase dulera to 200 if not well controlled on f/u     09/03/2014  Acute ov/Caitlin Newman re: bronchiectasis  Chief Complaint  Patient presents with  . Acute Visit    Pt c/o increased SOB, cough, chest tightness x 1 wk. She is coughing up very minimal clear sputum. She is using ventolin 3 x per day and neb x 1 per day.    holding out on using tramadol until "coughs so hard she hurts " Has med calendar/ not following action plans   >>augmentin and pred   12/04/2014 NP Follow up : Bronchiectasis  Returns for 3 month follow up. Says overall she is doing okay , had flare last ov , tx w/ augmentin and prednisone taper.  She got better . Has 1 other flare since last ov, resolved with augmentin.  She is feeling good today.  Requests to have diflucan dose to use if she gets yeast infection on abx. Very prone to this .  Advised to hold statin while on this for few days .  We reviewed all her meds and organized them into a med calendar with pt education  Appears to be taking correctly , except confused with reflux meds .  Taking prilosec, nexium at bedtime and stopped  pepcid.  She was recently started on nexium by PCP for reflux on prilosec.  She is suppose to be taking prilosec in am before meal and pepcid at bedtime  Says she gets reflux mainly at night when she lies down.  We discussed GERD diet and correct use of PPI/H2 blocker.  rec Take Omeprazole and Nexium before meal  GERD diet  Follow med calendar closely and bring to each visit.     02/24/2015  f/u ov/Caitlin Newman re: bronchiectasis  Chief Complaint  Patient presents with  . Follow-up    Doing well, no new co's. She uses albuterol inhaler 1 x per wk on average and has not needed neb.  rec Vit D should be powder, not oil  Continue pepcid at bedtime 20 mg  If not  Coughing wheezing, clearing throat or usual flare, ok to leave omeprazole  If  need to take augmentin, ok to take diflucan if needed for yeast infection See ent doctor as planned for your R ear  See calendar for specific medication instructions    09/01/2015  f/u ov/Caitlin Newman re: asthma /bronchiectasis  - maint rx dulera 100 2bid  Chief Complaint  Patient presents with  . Follow-up    Breathing is doing well and not coughing at this time. She uses albuterol inhaler 1 x daily on average and rarely uses neb. No new co's today.   no noct events, typically needs saba in afternoons with exertion Not using med calendar  rec Stop the pm dose of nexium See calendar for specific medication instructions    12/02/2015  f/u ov/Caitlin Newman re: asthma/ bronchiectasis  maint on dulera 100 2bid  Chief Complaint  Patient presents with  . Follow-up    Cough is unchanged. She denies any new co's today. She is using ventolin 2 x daily on average.    some watery pnd/ mild flare with fall weather despite neg RAST previously Not limited by breathing from desired activities   rec Please remember to go to the lab department downstairs for your tests - we will call you with the results when they are available. Work on inhaler technique:  relax and gently blow all the way out then take a nice smooth deep breath back in, triggering the inhaler at same time you start breathing in.  Hold for up to 5 seconds if you can. Blow out thru nose. Rinse and gargle with water when done Goal is to eliminate the airway trauma by using more mucinex dm and flutter as per your med calendar     03/02/2016  f/u ov/Caitlin Newman re: asthma/ bronchiectasis with nl pfts after am dulera 100 2bid/ Chief Complaint  Patient presents with  . Follow-up    PFT's done today. Breathing is doing well and no new co's.   main problem is steps cause doe/ ok flat in fact:  Doing treadmill avg daily x 20 min s stopping x 2.7 mph /0 grade  augmentin x qo month at most for flares rec Work toward 30 min of treadmill daily where you are short  of breath but never out of breath by changing the speed first the highest you are comfortable with and then the tilt  Consider returning to your sinus Doctor    08/31/2016  f/u ov/Caitlin Newman re: asthma/ brpnchiectasis Chief Complaint  Patient presents with  . Follow-up    Pt states that her cough is doing okay. Needs new rx for augmentin b/c hers expired- last taken a few months ago.  Not limited by breathing from desired activities  / doing 30 min treadmill s need for saba  saba sev times a week at most, usually when overdoing it   No obvious day to day or daytime variability or assoc excess/ purulent sputum or mucus plugs or hemoptysis or cp or chest tightness, subjective wheeze or overt sinus or hb symptoms. No unusual exp hx or h/o childhood pna/ asthma or knowledge of premature birth.  Sleeping ok without nocturnal  or early am exacerbation  of respiratory  c/o's or need for noct saba. Also denies any obvious fluctuation of symptoms with weather or environmental changes or other aggravating or alleviating factors except as outlined above   Current Medications, Allergies, Complete Past Medical History, Past Surgical History, Family History, and Social History were reviewed in [facility] electronic medical record.  ROS  The following are not active complaints unless bolded sore throat, dysphagia, dental problems, itching, sneezing,  nasal congestion or excess/ purulent secretions, ear ache,   fever, chills, sweats, unintended wt loss, classically pleuritic or exertional cp,  orthopnea pnd or leg swelling, presyncope, palpitations, abdominal pain, anorexia, nausea, vomiting, diarrhea  or change in bowel or bladder habits, change in stools or urine, dysuria,hematuria,  rash, arthralgias, visual complaints, headache, numbness, weakness or ataxia or problems with walking or coordination,  change in mood/affect or memory.                          Objective:   Physical Exam  amb wf    nad    Vital signs reviewed - Note on arrival 02 sats  96% on RA    Wt 156 08/06/2011  > 156 12/07/2011 > 12/27/2011  155>155 01/10/12 > 02/18/2012  158 > 157 03/24/2012  > 11/01/2012   149 > 150 11/20/2012 > 154  04/25/2013 >152 05/14/2013 > 06/25/2013  148 > 10/16/2013  147 > 10/23/2013 149>150 11/13/2013 > 12/13/2013  147 > 01/24/2014 150 >153 04/25/2014 >  09/03/2014 >144 12/04/2014 > 02/24/2015 143 > 08/31/2016  151    HEENT: nl dentition, turbinates, and orophanx. Nl external ear canals without cough reflex and   Mild bilateral non-specific  turbinate edema.      NECK :  without JVD/Nodes/TM/ nl carotid upstrokes bilaterally   LUNGS: no acc muscle use,   insp and exp rhonchi bilaterally   CV:  RRR  no s3 or murmur or increase in P2, no edema   ABD:  soft and nontender with nl excursion in the supine position. No bruits or organomegaly, bowel sounds nl  MS:  warm without deformities, calf tenderness, cyanosis or clubbing            Assessment & Plan:

## 2016-08-31 NOTE — Patient Instructions (Signed)
No change in medications  See calendar for specific medication instructions and bring it back for each and every office visit for every healthcare provider you see.  Without it,  you may not receive the best quality medical care that we feel you deserve.  You will note that the calendar groups together  your maintenance  medications that are timed at particular times of the day.  Think of this as your checklist for what your doctor has instructed you to do until your next evaluation to see what benefit  there is  to staying on a consistent group of medications intended to keep you well.  The other group at the bottom is entirely up to you to use as you see fit  for specific symptoms that may arise between visits that require you to treat them on an as needed basis.  Think of this as your action plan or "what if" list.   Separating the top medications from the bottom group is fundamental to providing you adequate care going forward.

## 2016-11-09 ENCOUNTER — Other Ambulatory Visit: Payer: Self-pay | Admitting: Internal Medicine

## 2017-01-19 ENCOUNTER — Ambulatory Visit (INDEPENDENT_AMBULATORY_CARE_PROVIDER_SITE_OTHER): Payer: Medicare Other | Admitting: Internal Medicine

## 2017-01-19 ENCOUNTER — Encounter: Payer: Self-pay | Admitting: Internal Medicine

## 2017-01-19 VITALS — BP 134/74 | HR 78 | Ht 60.0 in | Wt 151.8 lb

## 2017-01-19 DIAGNOSIS — J4530 Mild persistent asthma, uncomplicated: Secondary | ICD-10-CM

## 2017-01-19 DIAGNOSIS — J479 Bronchiectasis, uncomplicated: Secondary | ICD-10-CM | POA: Diagnosis not present

## 2017-01-19 DIAGNOSIS — Z23 Encounter for immunization: Secondary | ICD-10-CM

## 2017-01-19 NOTE — Assessment & Plan Note (Signed)
-    CT chest 01/10/12 1. Bilateral bronchiectasis is most evident in the left lower lobe where ground-glass attenuation and scarring is evident. No significant airspace consolidation is present. 2. Upper lobe bronchiectasis is slightly worse on the right. No significant parenchymal disease is associated. 3. Single calcified right paratracheal node may represent treated disease or a history granulomatous disease. Alpha one AT >  MM  02/2012  PFT's wnl 11/01/2012  Prevar rx 06/25/13 and pneumovax 01/24/2014  CTa 11/21/13 > no acute changes  - PFT's  03/02/2016  FEV1 2.03 (104 % ) ratio 86  p 4 % improvement from saba p dulera 100 x2   prior to study with DLCO  88/89c % corrects to 98 % for alv volume     Self managing well with use of med calendar with action plan at bottom   I had an extended discussion with the patient reviewing all relevant studies completed to date and  lasting 15 to 20 minutes of a 25 minute visit    Each maintenance medication was reviewed in detail including most importantly the difference between maintenance and prns and under what circumstances the prns are to be triggered using an action plan format that is not reflected in the computer generated alphabetically organized AVS but trather by a customized med calendar that reflects the AVS meds with confirmed 100% correlation.   In addition, Please see AVS for unique instructions that I personally wrote and verbalized to the the pt in detail and then reviewed with pt  by my nurse highlighting any  changes in therapy recommended at today's visit to their plan of care.

## 2017-01-19 NOTE — Patient Instructions (Signed)
For drainage / throat tickle try take CHLORPHENIRAMINE  4 mg - take one every 4 hours as needed - available over the counter- may cause drowsiness so start with just a bedtime dose or two and see how you tolerate it before trying in daytime    See calendar for specific medication instructions and bring it back for each and every office visit for every healthcare provider you see.  Without it,  you may not receive the best quality medical care that we feel you deserve.  You will note that the calendar groups together  your maintenance  medications that are timed at particular times of the day.  Think of this as your checklist for what your doctor has instructed you to do until your next evaluation to see what benefit  there is  to staying on a consistent group of medications intended to keep you well.  The other group at the bottom is entirely up to you to use as you see fit  for specific symptoms that may arise between visits that require you to treat them on an as needed basis.  Think of this as your action plan or "what if" list.   Separating the top medications from the bottom group is fundamental to providing you adequate care going forward.    Please schedule a follow up visit in 6 months but call sooner if needed

## 2017-01-19 NOTE — Assessment & Plan Note (Signed)
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  Contingencies discussed in full including contacting this office immediately if not controlling the symptoms using the rule of two's.

## 2017-01-19 NOTE — Progress Notes (Signed)
Subjective:    Patient ID: Caitlin Newman, female    DOB: 1945-06-18   MRN: [id number]  Brief patient profile:  70  yowf no sign smoking hx  With lots of wheezing as child outgrew at very young age (doesn't remember exactly when) then recurrent wheezing coughing worse in spring since around 1980 [name] until Nov 2012 with refractory cough since then so referred 08/06/2011 to pulmonary clinic by PA Ninfa LindenKathy Patterson from [place] with brochiectasis on CT 12/2011 and  nl pfts 11/01/2012    History of Present Illness  08/06/2011 1st pulmonary eval/ Surafel Hilleary cc persistent cough x 6 months acute onset with "flu" in November now present daily  - had been on advair maint and ACEI but the latter was stopped early May 2013 no improvement cough which is worse p eat and afternoons and cough for an hour at bedtime with sense of something stuck upper mid chest and coughs to point of choking/vomiting>  Productive of min yellow mucus. rec Stop advair, tessilon - only use albuterol (ventolin) puffer as rescue Start Pulmicort twice daily with nebulized albuterol until cough completely gone for a couple of weeks then restart the advair  Schedule sinus ct > Mild chronic sinusitis with mucosal edema in the maxillary sinus  bilaterally. Try prilosec 20mg   Take 30-60 min before first meal of the day and Pepcid 20 mg one bedtime until cough is completely gone for at least a week without the need for cough suppression GERD diet Prednisone 10 mg take  4 each am x 2 days,   2 each am x 2 days,  1 each am x2days and stop       01/24/2014 f/u ov/Caitlin Newman re: bronchiectasis s sign airflow obstr by pfts 11/01/12  Chief Complaint  Patient presents with  . Follow-up    Pt states that her cough is much improved. Breathing is doing well. No new co's today. She is using proair about 2 x per day and has used neb x 1 in the past wk.   thinks flutter helping   Mucus light yellow, never bloody Not limited by  breathing from desired activities   rec See calendar for specific medication  Late add consider increase dulera to 200 if not well controlled on f/u     09/03/2014  Acute ov/Caitlin Newman re: bronchiectasis  Chief Complaint  Patient presents with  . Acute Visit    Pt c/o increased SOB, cough, chest tightness x 1 wk. She is coughing up very minimal clear sputum. She is using ventolin 3 x per day and neb x 1 per day.    holding out on using tramadol until "coughs so hard she hurts " Has med calendar/ not following action plans   >>augmentin and pred   12/04/2014 NP Follow up : Bronchiectasis  Returns for 3 month follow up. Says overall she is doing okay , had flare last ov , tx w/ augmentin and prednisone taper.  She got better . Has 1 other flare since last ov, resolved with augmentin.  She is feeling good today.  Requests to have diflucan dose to use if she gets yeast infection on abx. Very prone to this .  Advised to hold statin while on this for few days .  We reviewed all her meds and organized them into a med calendar with pt education  Appears to be taking correctly , except confused with reflux meds .  Taking prilosec, nexium at bedtime and stopped  pepcid.  She was recently started on nexium by PCP for reflux on prilosec.  She is suppose to be taking prilosec in am before meal and pepcid at bedtime  Says she gets reflux mainly at night when she lies down.  We discussed GERD diet and correct use of PPI/H2 blocker.  rec Take Omeprazole and Nexium before meal  GERD diet  Follow med calendar closely and bring to each visit.     02/24/2015  f/u ov/Caitlin Newman re: bronchiectasis  Chief Complaint  Patient presents with  . Follow-up    Doing well, no new co's. She uses albuterol inhaler 1 x per wk on average and has not needed neb.  rec Vit D should be powder, not oil  Continue pepcid at bedtime 20 mg  If not  Coughing wheezing, clearing throat or usual flare, ok to leave omeprazole  If  need to take augmentin, ok to take diflucan if needed for yeast infection See ent doctor as planned for your R ear  See calendar for specific medication instructions     01/19/2017  f/u ov/Caitlin Newman re:  AB/Bronchiectasis  Chief Complaint  Patient presents with  . Follow-up    Pt states doing well. She uses her proair 2 x per wk on average. Last round of augmentin was taken approx 6 wks ago.   Not limited by breathing from desired activities   Using med calendar / action plan well and one round of augmentin only since last ov   No obvious day to day or daytime variability or assoc excess/ purulent sputum or mucus plugs or hemoptysis or cp or chest tightness, subjective wheeze or overt sinus or hb symptoms. No unusual exp hx or h/o childhood pna/ asthma or knowledge of premature birth.  Sleeping ok flat without nocturnal  or early am exacerbation  of respiratory  c/o's or need for noct saba. Also denies any obvious fluctuation of symptoms with weather or environmental changes or other aggravating or alleviating factors except as outlined above   Current Allergies, Complete Past Medical History, Past Surgical History, Family History, and Social History were reviewed in [employer] record.  ROS  The following are not active complaints unless bolded Hoarseness, sore throat, dysphagia, dental problems, itching, sneezing,  nasal congestion or discharge of excess mucus or purulent secretions, ear ache,   fever, chills, sweats, unintended wt loss or wt gain, classically pleuritic or exertional cp,  orthopnea pnd or leg swelling, presyncope, palpitations, abdominal pain, anorexia, nausea, vomiting, diarrhea  or change in bowel habits or change in bladder habits, change in stools or change in urine, dysuria, hematuria,  rash, arthralgias, visual complaints, headache, numbness, weakness or ataxia or problems with walking or coordination,  change in mood/affect or memory.        Current  Meds  Medication Sig  . albuterol (PROVENTIL HFA;VENTOLIN HFA) 108 (90 Base) MCG/ACT inhaler 2 puffs every 4-6 hours as needed for wheezing/shortness of breath  (((PLAN B)))  . albuterol (PROVENTIL) (2.5 MG/3ML) 0.083% nebulizer solution Take 3 mLs (2.5 mg total) by nebulization every 4 (four) hours as needed for wheezing or shortness of breath (((PLAN C))).  Marland Kitchen aspirin 81 MG tablet Take 81 mg by mouth at bedtime.   . chlorpheniramine (CHLOR-TRIMETON) 4 MG tablet Take 4 mg by mouth every 4 (four) hours as needed (drippy nose, throat clearing, post nasal drip).   . Cholecalciferol (VITAMIN D) 2000 units CAPS Take 1 capsule by mouth daily.  . Coenzyme Q10 (  CO Q 10 PO) Take 1 tablet daily by mouth.  . dextromethorphan-guaiFENesin (MUCINEX DM) 30-600 MG per 12 hr tablet Take 1 tablet by mouth every 12 hours as needed with flutter valve for congestion and cough -PLAN B  . DULERA 100-5 MCG/ACT AERO INHALE 2 PUFFS BY MOUTH EVERY 12 HOURS  . famotidine (PEPCID) 20 MG tablet Take 20 mg by mouth at bedtime.  . Omeprazole 20 MG TBEC Take 30- 60 min before your first meal of the day  . Respiratory Therapy Supplies (FLUTTER) DEVI Use as directed.  . rosuvastatin (CRESTOR) 5 MG tablet Take 5 mg by mouth daily.  . sertraline (ZOLOFT) 50 MG tablet Take 50 mg by mouth at bedtime.   . sodium chloride (OCEAN) 0.65 % SOLN nasal spray Rinse as needed for nasal congestion  . Sodium Chloride-Sodium Bicarb (AYR SALINE NASAL RINSE) 1.57 G PACK Rinses as needed  . traMADol (ULTRAM) 50 MG tablet 1-2 every 4 hours as needed for cough or pain                                    Objective:   Physical Exam  amb wf   nad  - all smiles  Vital signs reviewed - Note on arrival 02 sats  97% on RA    Wt 156 08/06/2011  > 156 12/07/2011 > 12/27/2011  155>155 01/10/12 > 02/18/2012  158 > 157 03/24/2012  > 11/01/2012   149 > 150 11/20/2012 > 154  04/25/2013 >152 05/14/2013 > 06/25/2013  148 > 10/16/2013  147 > 10/23/2013 149>150  11/13/2013 > 12/13/2013  147 > 01/24/2014 150 >153 04/25/2014 >  09/03/2014 >144 12/04/2014 > 02/24/2015 143 > 08/31/2016  151  > 01/19/2017  132    HEENT: nl dentition, turbinates bilaterally, and oropharynx. Nl external ear canals without cough reflex   NECK :  without JVD/Nodes/TM/ nl carotid upstrokes bilaterally   LUNGS: no acc muscle use,  Nl contour chest  With  Mildly sonorous  insp and exp rhonchi bilaterally    CV:  RRR  no s3 or murmur or increase in P2, and no edema   ABD:  soft and nontender with nl inspiratory excursion in the supine position. No bruits or organomegaly appreciated, bowel sounds nl  MS:  Nl gait/ ext warm without deformities, calf tenderness, cyanosis or clubbing No obvious joint restrictions   SKIN: warm and dry without lesions    NEURO:  alert, approp, nl sensorium with  no motor or cerebellar deficits apparent.             Assessment & Plan:

## 2017-02-21 ENCOUNTER — Ambulatory Visit: Payer: Medicare Other | Admitting: Internal Medicine

## 2017-07-19 ENCOUNTER — Ambulatory Visit: Payer: Medicare Other | Admitting: Internal Medicine

## 2017-10-05 ENCOUNTER — Telehealth: Payer: Self-pay | Admitting: Internal Medicine

## 2017-10-05 ENCOUNTER — Other Ambulatory Visit: Payer: Self-pay | Admitting: Internal Medicine

## 2017-10-05 NOTE — Telephone Encounter (Signed)
Spoke with patient-states she needs Dulera 100 Inhaler called to pharmacy in [place] where is she at. Explained to patient that Rx was sent electronically today at 2:59pm. Pt is aware and nothing more needed at this time.

## 2017-10-20 ENCOUNTER — Encounter: Payer: Self-pay | Admitting: Internal Medicine

## 2017-10-20 ENCOUNTER — Ambulatory Visit (INDEPENDENT_AMBULATORY_CARE_PROVIDER_SITE_OTHER): Payer: Medicare Other | Admitting: Internal Medicine

## 2017-10-20 VITALS — BP 128/80 | HR 80 | Ht 60.0 in | Wt 156.0 lb

## 2017-10-20 DIAGNOSIS — J4530 Mild persistent asthma, uncomplicated: Secondary | ICD-10-CM | POA: Diagnosis not present

## 2017-10-20 DIAGNOSIS — J471 Bronchiectasis with (acute) exacerbation: Secondary | ICD-10-CM

## 2017-10-20 MED ORDER — AMOXICILLIN-POT CLAVULANATE 875-125 MG PO TABS: 1.0000 | ORAL_TABLET | Freq: Two times a day (BID) | ORAL | 11 refills | Status: DC

## 2017-10-20 MED ORDER — MOMETASONE FURO-FORMOTEROL FUM 200-5 MCG/ACT IN AERO: INHALATION_SPRAY | RESPIRATORY_TRACT | 11 refills | Status: DC

## 2017-10-20 MED ORDER — FLUCONAZOLE 100 MG PO TABS: 100.0000 mg | ORAL_TABLET | Freq: Every day | ORAL | 11 refills | Status: DC | PRN

## 2017-10-20 NOTE — Progress Notes (Signed)
Subjective:    Patient ID: Caitlin Newman, female    DOB: 1945-06-18   MRN: [id number]  Brief patient profile:  70  yowf no sign smoking hx  With lots of wheezing as child outgrew at very young age (doesn't remember exactly when) then recurrent wheezing coughing worse in spring since around 1980 [name] until Nov 2012 with refractory cough since then so referred 08/06/2011 to pulmonary clinic by PA Ninfa LindenKathy Patterson from [place] with brochiectasis on CT 12/2011 and  nl pfts 11/01/2012    History of Present Illness  08/06/2011 1st pulmonary eval/ Chandell Attridge cc persistent cough x 6 months acute onset with "flu" in November now present daily  - had been on advair maint and ACEI but the latter was stopped early May 2013 no improvement cough which is worse p eat and afternoons and cough for an hour at bedtime with sense of something stuck upper mid chest and coughs to point of choking/vomiting>  Productive of min yellow mucus. rec Stop advair, tessilon - only use albuterol (ventolin) puffer as rescue Start Pulmicort twice daily with nebulized albuterol until cough completely gone for a couple of weeks then restart the advair  Schedule sinus ct > Mild chronic sinusitis with mucosal edema in the maxillary sinus  bilaterally. Try prilosec 20mg   Take 30-60 min before first meal of the day and Pepcid 20 mg one bedtime until cough is completely gone for at least a week without the need for cough suppression GERD diet Prednisone 10 mg take  4 each am x 2 days,   2 each am x 2 days,  1 each am x2days and stop       01/24/2014 f/u ov/Roslin Norwood re: bronchiectasis s sign airflow obstr by pfts 11/01/12  Chief Complaint  Patient presents with  . Follow-up    Pt states that her cough is much improved. Breathing is doing well. No new co's today. She is using proair about 2 x per day and has used neb x 1 in the past wk.   thinks flutter helping   Mucus light yellow, never bloody Not limited by  breathing from desired activities   rec See calendar for specific medication  Late add consider increase dulera to 200 if not well controlled on f/u     09/03/2014  Acute ov/[name] re: bronchiectasis  Chief Complaint  Patient presents with  . Acute Visit    Pt c/o increased SOB, cough, chest tightness x 1 wk. She is coughing up very minimal clear sputum. She is using ventolin 3 x per day and neb x 1 per day.    holding out on using tramadol until "coughs so hard she hurts " Has med calendar/ not following action plans   >>augmentin and pred   12/04/2014 NP Follow up : Bronchiectasis  Returns for 3 month follow up. Says overall she is doing okay , had flare last ov , tx w/ augmentin and prednisone taper.  She got better . Has 1 other flare since last ov, resolved with augmentin.  She is feeling good today.  Requests to have diflucan dose to use if she gets yeast infection on abx. Very prone to this .  Advised to hold statin while on this for few days .  We reviewed all her meds and organized them into a med calendar with pt education  Appears to be taking correctly , except confused with reflux meds .  Taking prilosec, nexium at bedtime and stopped  pepcid.  She was recently started on nexium by PCP for reflux on prilosec.  She is suppose to be taking prilosec in am before meal and pepcid at bedtime  Says she gets reflux mainly at night when she lies down.  We discussed GERD diet and correct use of PPI/H2 blocker.  rec Take Omeprazole and Nexium before meal  GERD diet  Follow med calendar closely and bring to each visit.     02/24/2015  f/u ov/[name] re: bronchiectasis  Chief Complaint  Patient presents with  . Follow-up    Doing well, no new co's. She uses albuterol inhaler 1 x per wk on average and has not needed neb.  rec Vit D should be powder, not oil  Continue pepcid at bedtime 20 mg  If not  Coughing wheezing, clearing throat or usual flare, ok to leave omeprazole  If  need to take augmentin, ok to take diflucan if needed for yeast infection See ent doctor as planned for your R ear  See calendar for specific medication instructions     01/19/2017  f/u ov/Orlondo Holycross re:  AB/Bronchiectasis  Chief Complaint  Patient presents with  . Follow-up    Pt states doing well. She uses her proair 2 x per wk on average. Last round of augmentin was taken approx 6 wks ago.   Not limited by breathing from desired activities   Using med calendar / action plan well and one round of augmentin only since last ov  rec For drainage / throat tickle try take CHLORPHENIRAMINE  4 mg - take one every 4 hours as needed - available over the counter- may cause drowsiness so start with just a bedtime dose or two and see how you tolerate it before trying in daytime      10/20/2017  f/u ov/[name] re:  AB/ bronchiectasis  Chief Complaint  Patient presents with  . Follow-up    Breathing is doing well. She is using her proair at least 2 x per day. She rarely uses neb.   Dyspnea:  Not limited by breathing from desired activities  / no aerobics / some steps ok Cough: no change baseline esp in am tends yellow just then   SABA use: 2 x daily   No obvious day to day or daytime variability or assoc excess/ purulent sputum or mucus plugs or hemoptysis or cp or chest tightness, subjective wheeze or overt sinus or hb symptoms.   Sleeping: ok / props up 2 pillows without nocturnal  or early am exacerbation  of respiratory  c/o's or need for noct saba. Also denies any obvious fluctuation of symptoms with weather or environmental changes or other aggravating or alleviating factors except as outlined above   No unusual exposure hx or h/o childhood pna/ asthma or knowledge of premature birth.  Current Allergies, Complete Past Medical History, Past Surgical History, Family History, and Social History were reviewed in [facility] electronic medical record.  ROS  The following are not active complaints  unless bolded Hoarseness, sore throat, dysphagia, dental problems, itching, sneezing,  nasal congestion or discharge of excess mucus or purulent secretions, ear ache,   fever, chills, sweats, unintended wt loss or wt gain, classically pleuritic or exertional cp,  orthopnea pnd or arm/hand swelling  or leg swelling, presyncope, palpitations, abdominal pain, anorexia, nausea, vomiting, diarrhea  or change in bowel habits or change in bladder habits, change in stools or change in urine, dysuria, hematuria,  rash, arthralgias, visual complaints, headache, numbness, weakness  or ataxia or problems with walking or coordination,  change in mood or  memory.        Current Meds  Medication Sig  . albuterol (PROVENTIL HFA;VENTOLIN HFA) 108 (90 Base) MCG/ACT inhaler 2 puffs every 4-6 hours as needed for wheezing/shortness of breath  (((PLAN B)))  . albuterol (PROVENTIL) (2.5 MG/3ML) 0.083% nebulizer solution Take 3 mLs (2.5 mg total) by nebulization every 4 (four) hours as needed for wheezing or shortness of breath (((PLAN C))).  Marland Kitchen aspirin 81 MG tablet Take 81 mg by mouth at bedtime.   . chlorpheniramine (CHLOR-TRIMETON) 4 MG tablet Take 4 mg by mouth every 4 (four) hours as needed (drippy nose, throat clearing, post nasal drip).   . Cholecalciferol (VITAMIN D) 2000 units CAPS Take 1 capsule by mouth daily.  . Coenzyme Q10 (CO Q 10 PO) Take 1 tablet daily by mouth.  . DULERA 100-5 MCG/ACT AERO INHALE 2 PUFFS INTO THE LUNGS EVERY 12 HOURS  . famotidine (PEPCID) 20 MG tablet Take 20 mg by mouth at bedtime.  . fluconazole (DIFLUCAN) 100 MG tablet Take 1 tablet (100 mg total) by mouth daily as needed.  . Omeprazole 20 MG TBEC Take 30- 60 min before your first meal of the day  . Respiratory Therapy Supplies (FLUTTER) DEVI Use as directed.  . rosuvastatin (CRESTOR) 5 MG tablet Take 5 mg by mouth daily.  . traMADol (ULTRAM) 50 MG tablet 1-2 every 4 hours as needed for cough or pain  . [DISCONTINUED] fluconazole  (DIFLUCAN) 100 MG tablet Take 100 mg by mouth daily as needed.                                        Objective:   Physical Exam  amb wf nad  Vital signs reviewed - Note on arrival 02 sats  96% on RA       Wt 156 08/06/2011  > 156 12/07/2011 > 12/27/2011  155>155 01/10/12 > 02/18/2012  158 > 157 03/24/2012  > 11/01/2012   149 > 150 11/20/2012 > 154  04/25/2013 >152 05/14/2013 > 06/25/2013  148 > 10/16/2013  147 > 10/23/2013 149>150 11/13/2013 > 12/13/2013  147 > 01/24/2014 150 >153 04/25/2014 >  09/03/2014 >144 12/04/2014 > 02/24/2015 143 > 08/31/2016  151  > 01/19/2017  132 > 10/20/2017   156      HEENT: nl dentition, turbinates bilaterally, and oropharynx. Nl external ear canals without cough reflex   NECK :  without JVD/Nodes/TM/ nl carotid upstrokes bilaterally   LUNGS: no acc muscle use,  Nl contour chest with mildly sonorous scatterd  insp/exp  bilaterally without cough on insp or exp maneuvers   CV:  RRR  no s3 or murmur or increase in P2, and no edema   ABD:  soft and nontender with nl inspiratory excursion in the supine position. No bruits or organomegaly appreciated, bowel sounds nl  MS:  Nl gait/ ext warm without deformities, calf tenderness, cyanosis or clubbing No obvious joint restrictions   SKIN: warm and dry without lesions    NEURO:  alert, approp, nl sensorium with  no motor or cerebellar deficits apparent.              Assessment & Plan:

## 2017-10-20 NOTE — Patient Instructions (Signed)
When needing more proair than usual, try dulera 200 Take 2 puffs first thing in am and then another 2 puffs about 12 hours later for at least a week before try the lower strength  Follow up is as needed

## 2017-10-23 ENCOUNTER — Encounter: Payer: Self-pay | Admitting: Internal Medicine

## 2017-10-23 NOTE — Assessment & Plan Note (Signed)
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 - hfa 75% p coaching 11/01/2012 and can't afford pulmocort neb > rechallenged with dulera 100 2bid  -Med calendar 11/20/2012    - FENO 12/02/2015  =   15 on dulera 100 2bid    - 10/20/2017  After extensive coaching inhaler device  effectiveness =    90%   For future  flares rec double the strength of dulera = dulera 200 2bid x one week min

## 2017-10-23 NOTE — Assessment & Plan Note (Addendum)
-    CT chest 01/10/12 1. Bilateral bronchiectasis is most evident in the left lower lobe where ground-glass attenuation and scarring is evident. No significant airspace consolidation is present. 2. Upper lobe bronchiectasis is slightly worse on the right. No significant parenchymal disease is associated. 3. Single calcified right paratracheal node may represent treated disease or a history granulomatous disease. Alpha one AT >  MM  02/2012  PFT's wnl 11/01/2012  Prevar rx 06/25/13 and pneumovax 01/24/2014  CTa 11/21/13 > no acute changes  - PFT's  03/02/2016  FEV1 2.03 (104 % ) ratio 86  p 4 % improvement from saba p dulera 100 x2   prior to study with DLCO  88/89c % corrects to 98 % for alv volume     Adequate control on present rx, reviewed in detail with pt > no change in rx needed     I had an extended discussion with the patient reviewing all relevant studies completed to date and  lasting 15 to 20 minutes of a 25 minute visit    See device teaching which extended face to face time for this visit.  Each maintenance medication was reviewed in detail including emphasizing most importantly the difference between maintenance and prns and under what circumstances the prns are to be triggered using an action plan format that is not reflected in the computer generated alphabetically organized AVS which I have not found useful in most complex patients, especially with respiratory illnesses  Please see AVS for specific instructions unique to this visit that I personally wrote and verbalized to the the pt in detail and then reviewed with pt  by my nurse highlighting any  changes in therapy recommended at today's visit to their plan of care.     Pulmonary f/u can be on prn basis if pcp willing to refill meds

## 2018-01-05 ENCOUNTER — Other Ambulatory Visit: Payer: Self-pay | Admitting: Internal Medicine

## 2018-02-21 ENCOUNTER — Telehealth: Payer: Self-pay | Admitting: Internal Medicine

## 2018-02-21 MED ORDER — MOMETASONE FURO-FORMOTEROL FUM 100-5 MCG/ACT IN AERO: 2.0000 | INHALATION_SPRAY | Freq: Two times a day (BID) | RESPIRATORY_TRACT | 3 refills | Status: DC

## 2018-02-21 NOTE — Telephone Encounter (Signed)
Pt requesting a 3 month rx for Dulera 100 to be [facility].  This has been sent.  Nothing further needed.

## 2018-02-24 ENCOUNTER — Telehealth: Payer: Self-pay | Admitting: Internal Medicine

## 2018-02-24 NOTE — Telephone Encounter (Signed)
[facility] [place] Drug requested: Dulera 100 CMM Key: [id number] [id number] GEX:[id number]:[id number] RxGroup: [id number]  Initiated PA via [email or web address], received following error message: Outcome  Additional Information Required Drug is covered by current benefit plan. No further PA activity needed.  Called pharmacy to make aware.  Nothing further needed at this time.

## 2018-04-07 ENCOUNTER — Telehealth: Payer: Self-pay | Admitting: Internal Medicine

## 2018-04-07 NOTE — Telephone Encounter (Signed)
Received letter from [facility] stating that the Dulera 100 mg is no longer covered  Dr Sherene SiresWert rec that we try sending in Symbicort 80  LMTCB

## 2018-04-11 NOTE — Telephone Encounter (Signed)
LMTCB

## 2018-04-13 NOTE — Telephone Encounter (Signed)
LMTCB and will close per protocol  

## 2018-07-19 ENCOUNTER — Telehealth: Payer: Self-pay | Admitting: Internal Medicine

## 2018-07-19 NOTE — Telephone Encounter (Signed)
Per her chart, it looks like we received the same call back in January.   Back in January 2020, it was suggested that she try Symbicort 80 in place of the [facility].   Left message for patient to call back to see if her insurance has changed before I attempt to pull her formulary.

## 2018-07-20 ENCOUNTER — Telehealth: Payer: Self-pay | Admitting: Internal Medicine

## 2018-07-20 DIAGNOSIS — J4530 Mild persistent asthma, uncomplicated: Secondary | ICD-10-CM

## 2018-07-20 DIAGNOSIS — J471 Bronchiectasis with (acute) exacerbation: Secondary | ICD-10-CM

## 2018-07-20 MED ORDER — BUDESONIDE-FORMOTEROL FUMARATE 80-4.5 MCG/ACT IN AERO: 2.0000 | INHALATION_SPRAY | Freq: Two times a day (BID) | RESPIRATORY_TRACT | 3 refills | Status: DC

## 2018-07-20 MED ORDER — FLUTICASONE-SALMETEROL 115-21 MCG/ACT IN AERO: 2.0000 | INHALATION_SPRAY | Freq: Two times a day (BID) | RESPIRATORY_TRACT | 0 refills | Status: DC

## 2018-07-20 NOTE — Telephone Encounter (Signed)
advair 115 is the equivalent = Take 2 puffs first thing in am and then another 2 puffs about 12 hours later.

## 2018-07-20 NOTE — Telephone Encounter (Signed)
Called the patient and advised of Dr. Thurston Hole recommendation of Advair since the [facility] was too expensive. Gave patient usage directions per Dr. Sherene Sires.   Patient agreed to try the Advair for one month to make sure it works first. Patient agreed to call us back if it does so additional refills can be sent.   Patient requested for this prescription to be sent to the [facility], [place]. Prescription sent. Nothing further needed at this time.

## 2018-07-20 NOTE — Telephone Encounter (Signed)
Called and spoke with pt in regards to previous message from 04/07/2018 to where we received the fax from [facility] that dulera was no longer going to be covered and MW's recommendation was to send in Rx for Symb 80 in place of [facility]. After stating this info to pt, pt stated to go ahead and send Rx of Symb 80 to pharmacy for her. I verified pt's preferred pharmacy and sent Rx in. I also removed Dulera from pt's med list since it was not covered by insurance.  nothing further needed.

## 2018-07-20 NOTE — Telephone Encounter (Signed)
Patient is returning phone call.  Patient phone number is [phone].  Patient has Medicare primary and [employer] secondary.

## 2018-07-20 NOTE — Telephone Encounter (Signed)
Pt states can afford afford Advair and Advair only. She paid would pay $600 Dulera. She would pay $720 for Symbicort for 3 month supply. Pt simply can not afford this and her other medications. She is requesting script for Advair.

## 2018-08-31 ENCOUNTER — Other Ambulatory Visit: Payer: Self-pay

## 2018-08-31 ENCOUNTER — Encounter: Payer: Self-pay | Admitting: Internal Medicine

## 2018-08-31 ENCOUNTER — Ambulatory Visit (INDEPENDENT_AMBULATORY_CARE_PROVIDER_SITE_OTHER): Payer: Medicare Other | Admitting: Internal Medicine

## 2018-08-31 DIAGNOSIS — J471 Bronchiectasis with (acute) exacerbation: Secondary | ICD-10-CM | POA: Diagnosis not present

## 2018-08-31 DIAGNOSIS — J4530 Mild persistent asthma, uncomplicated: Secondary | ICD-10-CM | POA: Diagnosis not present

## 2018-08-31 MED ORDER — BUDESONIDE-FORMOTEROL FUMARATE 160-4.5 MCG/ACT IN AERO: 2.0000 | INHALATION_SPRAY | Freq: Two times a day (BID) | RESPIRATORY_TRACT | 0 refills | Status: DC

## 2018-08-31 MED ORDER — ADVAIR HFA 115-21 MCG/ACT IN AERO: 2.0000 | INHALATION_SPRAY | Freq: Two times a day (BID) | RESPIRATORY_TRACT | 11 refills | Status: DC

## 2018-08-31 MED ORDER — ALBUTEROL SULFATE (2.5 MG/3ML) 0.083% IN NEBU: 2.5000 mg | INHALATION_SOLUTION | RESPIRATORY_TRACT | 11 refills | Status: DC | PRN

## 2018-08-31 NOTE — Assessment & Plan Note (Signed)
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 identified - hfa 75% p coaching 11/01/2012 and can't afford pulmocort neb > rechallenged with dulera 100 2bid  -Med calendar 11/20/2012    - FENO 12/02/2015  =   15 on dulera 100 2bid   - 08/31/2018  After extensive coaching inhaler device,  effectiveness =    90% so add symb 160 2bid for flares if ab to avoid steroids, o/w maint on advair 115 2bid for insurance   Mild flare presently > symbicort 160samples given  Advised:  formulary restrictions will be an ongoing challenge for the forseable future and I would be happy to pick an alternative if the pt will first  provide me a list of them -  pt  will need to return here for training for any new device that is required eg dpi vs hfa vs respimat.    In the meantime we can always provide samples so that the patient never runs out of any needed respiratory medications.    I had an extended discussion with the patient reviewing all relevant studies completed to date and  lasting 15 to 20 minutes of a 25 minute visit    See device teaching which extended face to face time for this visit   Each maintenance medication was reviewed in detail including most importantly the difference between maintenance and prns and under what circumstances the prns are to be triggered using an action plan format that is not reflected in the computer generated alphabetically organized AVS but trather by a customized med calendar that reflects the AVS meds with confirmed 100% correlation.   In addition, Please see AVS for unique instructions that I personally wrote and verbalized to the the pt in detail and then reviewed with pt  by my nurse highlighting any  changes in therapy recommended at today's visit to their plan of care.

## 2018-08-31 NOTE — Patient Instructions (Addendum)
When breathing/ coughing worse > symbicort 160  Take 2 puffs first thing in am and then another 2 puffs about 12 hours later until better then resume the advair    See calendar for specific medication instructions and bring it back for each and every office visit for every healthcare provider you see.  Without it,  you may not receive the best quality medical care that we feel you deserve.  You will note that the calendar groups together  your maintenance  medications that are timed at particular times of the day.  Think of this as your checklist for what your doctor has instructed you to do until your next evaluation to see what benefit  there is  to staying on a consistent group of medications intended to keep you well.  The other group at the bottom is entirely up to you to use as you see fit  for specific symptoms that may arise between visits that require you to treat them on an as needed basis.  Think of this as your action plan or "what if" list.   Separating the top medications from the bottom group is fundamental to providing you adequate care going forward.     Follow up can be as needed but we need to see you once a year to prescribe for you

## 2018-08-31 NOTE — Progress Notes (Addendum)
Subjective:   Patient ID: Caitlin Newman, female    DOB: [birth year]   MRN: [id number]   Brief patient profile:  72  yowf no significant  smoking hx  With lots of wheezing as child outgrew at very young age (doesn't remember exactly when) then recurrent wheezing coughing worse in spring since around 1980 prev seen by [facility] well controlled until Nov 2012 with refractory cough since then so referred 08/06/2011 to pulmonary clinic by PA Ninfa Linden from [place] with brochiectasis on CT 12/2011 and  nl pfts 11/01/2012    History of Present Illness  08/06/2011 1st pulmonary eval/ Roshawnda Pecora cc persistent cough x 6 months acute onset with "flu" in November now present daily  - had been on advair maint and ACEI but the latter was stopped early May 2013 no improvement cough which is worse p eat and afternoons and cough for an hour at bedtime with sense of something stuck upper mid chest and coughs to point of choking/vomiting>  Productive of min yellow mucus. rec Stop advair, tessilon - only use albuterol (ventolin) puffer as rescue Start Pulmicort twice daily with nebulized albuterol until cough completely gone for a couple of weeks then restart the advair  Schedule sinus ct > Mild chronic sinusitis with mucosal edema in the maxillary sinus  bilaterally. Try prilosec 20mg   Take 30-60 min before first meal of the day and Pepcid 20 mg one bedtime until cough is completely gone for at least a week without the need for cough suppression GERD diet Prednisone 10 mg take  4 each am x 2 days,   2 each am x 2 days,  1 each am x2days and stop       01/24/2014 f/u ov/Petrea Fredenburg re: bronchiectasis s sign airflow obstr by pfts 11/01/12  Chief Complaint  Patient presents with  . Follow-up    Pt states that her cough is much improved. Breathing is doing well. No new co's today. She is using proair about 2 x per day and has used neb x 1 in the past wk.   thinks flutter helping   Mucus light yellow, never bloody Not  limited by breathing from desired activities   rec See calendar for specific medication  Late add consider increase dulera to 200 if not well controlled on f/u     09/03/2014  Acute ov/Staria Birkhead re: bronchiectasis  Chief Complaint  Patient presents with  . Acute Visit    Pt c/o increased SOB, cough, chest tightness x 1 wk. She is coughing up very minimal clear sputum. She is using ventolin 3 x per day and neb x 1 per day.    holding out on using tramadol until "coughs so hard she hurts " Has med calendar/ not following action plans   >>augmentin and pred   12/04/2014 NP Follow up : Bronchiectasis  Returns for 3 month follow up. Says overall she is doing okay , had flare last ov , tx w/ augmentin and prednisone taper.  She got better . Has 1 other flare since last ov, resolved with augmentin.  She is feeling good today.  Requests to have diflucan dose to use if she gets yeast infection on abx. Very prone to this .  Advised to hold statin while on this for few days .  We reviewed all her meds and organized them into a med calendar with pt education  Appears to be taking correctly , except confused with reflux meds .  Taking prilosec, nexium at bedtime and  stopped pepcid.  She was recently started on nexium by PCP for reflux on prilosec.  She is suppose to be taking prilosec in am before meal and pepcid at bedtime  Says she gets reflux mainly at night when she lies down.  We discussed GERD diet and correct use of PPI/H2 blocker.  rec Take Omeprazole and Nexium before meal  GERD diet  Follow med calendar closely and bring to each visit.     02/24/2015  f/u ov/Macallan Ord re: bronchiectasis  Chief Complaint  Patient presents with  . Follow-up    Doing well, no new co's. She uses albuterol inhaler 1 x per wk on average and has not needed neb.  rec Vit D should be powder, not oil  Continue pepcid at bedtime 20 mg  If not  Coughing wheezing, clearing throat or usual flare, ok to leave  omeprazole  If need to take augmentin, ok to take diflucan if needed for yeast infection See ent doctor as planned for your R ear  See calendar for specific medication instructions     01/19/2017  f/u ov/Toren Tucholski re:  AB/Bronchiectasis  Chief Complaint  Patient presents with  . Follow-up    Pt states doing well. She uses her proair 2 x per wk on average. Last round of augmentin was taken approx 6 wks ago.   Not limited by breathing from desired activities   Using med calendar / action plan well and one round of augmentin only since last ov  rec For drainage / throat tickle try take CHLORPHENIRAMINE  4 mg - take one every 4 hours as needed - available over the counter- may cause drowsiness so start with just a bedtime dose or two and see how you tolerate it before trying in daytime      10/20/2017  f/u ov/Ilah Boule re:  AB/ bronchiectasis  Chief Complaint  Patient presents with  . Follow-up    Breathing is doing well. She is using her proair at least 2 x per day. She rarely uses neb.   Dyspnea:  Not limited by breathing from desired activities  / no aerobics / some steps ok Cough: no change baseline esp in am tends yellow just then SABA use: 2 x daily  rec When needing more proair than usual, try dulera 200 Take 2 puffs first thing in am and then another 2 puffs about 12 hours later for at least a week before try the lower strength    08/31/2018  f/u ov/Madalynne Gutmann re: ab/ bronchiectasis maint on advair 115 2 bid no longer has dulera 200 for flares  Chief Complaint  Patient presents with  . Follow-up    breathing is "my usual" she is using her proair 2 x per day on average and neb with albuterol 2 x per wk.   Dyspnea:  Not limited by breathing from desired activities  / steps are harder now since returned from [place] Cough: some worse x 2 weeks > yellowish , thick more after and persists at hs better p neb  Sleeping: pillows    SABA use:  As above  02: none    No obvious day to day or  daytime variability or assoc mucus plugs or hemoptysis or cp or chest tightness, subjective wheeze or overt sinus or hb symptoms.    Also denies any obvious fluctuation of symptoms with weather or environmental changes or other aggravating or alleviating factors except as outlined above   No unusual exposure hx or h/o childhood pna  or knowledge of premature birth.  Current Allergies, Complete Past Medical History, Past Surgical History, Family History, and Social History were reviewed in [employer] record.  ROS  The following are not active complaints unless bolded Hoarseness, sore throat, dysphagia, dental problems, itching, sneezing,  nasal congestion or discharge of excess mucus or purulent secretions, ear ache,   fever, chills, sweats, unintended wt loss or wt gain, classically pleuritic or exertional cp,  orthopnea pnd or arm/hand swelling  or leg swelling, presyncope, palpitations, abdominal pain, anorexia, nausea, vomiting, diarrhea  or change in bowel habits or change in bladder habits, change in stools or change in urine, dysuria, hematuria,  rash, arthralgias, visual complaints, headache, numbness, weakness or ataxia or problems with walking or coordination,  change in mood or  memory.        Current Meds  Medication Sig  . albuterol (PROVENTIL HFA;VENTOLIN HFA) 108 (90 Base) MCG/ACT inhaler INHALE 2 PUFFS BY MOUTH EVERY 4 TO 6 HOURS AS NEEDED FOR WHEEING/SHORTNESS OF BREATH  . albuterol (PROVENTIL) (2.5 MG/3ML) 0.083% nebulizer solution Take 3 mLs (2.5 mg total) by nebulization every 4 (four) hours as needed for wheezing or shortness of breath (((PLAN C))).  Marland Kitchen aspirin 81 MG tablet Take 81 mg by mouth at bedtime.   . chlorpheniramine (CHLOR-TRIMETON) 4 MG tablet Take 4 mg by mouth every 4 (four) hours as needed (drippy nose, throat clearing, post nasal drip).   . Cholecalciferol (VITAMIN D) 2000 units CAPS Take 1 capsule by mouth daily.  . Coenzyme Q10 (CO Q 10  PO) Take 1 tablet daily by mouth.  . DULERA 100-5 MCG/ACT AERO INHALE 2 PUFFS INTO THE LUNGS EVERY 12 HOURS  . famotidine (PEPCID) 20 MG tablet Take 20 mg by mouth at bedtime.  . fluticasone-salmeterol (ADVAIR HFA) 115-21 MCG/ACT inhaler Inhale 2 puffs into the lungs 2 (two) times daily.  . Omeprazole 20 MG TBEC Take 30- 60 min before your first meal of the day  . Respiratory Therapy Supplies (FLUTTER) DEVI Use as directed.  . rosuvastatin (CRESTOR) 5 MG tablet Take 5 mg by mouth daily.  . sertraline (ZOLOFT) 100 MG tablet Take 1 tablet by mouth daily.                   Objective:   Physical Exam  amb wf nad     Wt 156 08/06/2011  > 156 12/07/2011 > 12/27/2011  155>155 01/10/12 > 02/18/2012  158 > 157 03/24/2012  > 11/01/2012   149 > 150 11/20/2012 > 154  04/25/2013 >152 05/14/2013 > 06/25/2013  148 > 10/16/2013  147 > 10/23/2013 149>150 11/13/2013 > 12/13/2013  147 > 01/24/2014 150 >153 04/25/2014 >  09/03/2014 >144 12/04/2014 > 02/24/2015 143 > 08/31/2016  151  > 01/19/2017  132 > 10/20/2017   156 > 08/31/2018   Vital signs reviewed - Note on arrival 02 sats  97% on RA      HEENT: nl dentition, turbinates bilaterally, and oropharynx. Nl external ear canals without cough reflex   NECK :  without JVD/Nodes/TM/ nl carotid upstrokes bilaterally   LUNGS: no acc muscle use,  Nl contour chest sonorous mid exp rhonchi bilaterally/ better with plm/  without cough on insp or exp maneuvers   CV:  RRR  no s3 or murmur or increase in P2, and no edema   ABD:  soft and nontender with nl inspiratory excursion in the supine position. No bruits or organomegaly appreciated, bowel sounds nl  MS:  Nl gait/ ext warm without deformities, calf tenderness, cyanosis or clubbing No obvious joint restrictions   SKIN: warm and dry without lesions    NEURO:  alert, approp, nl sensorium with  no motor or cerebellar deficits apparent.             Assessment & Plan:

## 2018-09-01 ENCOUNTER — Telehealth: Payer: Self-pay | Admitting: Internal Medicine

## 2018-09-01 MED ORDER — ALBUTEROL SULFATE (2.5 MG/3ML) 0.083% IN NEBU: 2.5000 mg | INHALATION_SOLUTION | RESPIRATORY_TRACT | 11 refills | Status: DC | PRN

## 2018-09-01 NOTE — Telephone Encounter (Signed)
Called & spoke w/ pt. Pt states she could not pick up her albuterol nebulizer solution yesterday at [facility] because an order was never sent. I check pt's medications in chart and noticed this medication was sent yesterday 09/10/2018 75 mL x11 refills, however, it was sent to the wrong pharmacy. I let pt know I would take care of this and send the medication to the correct pharmacy. Pt verbalized understanding.   Order for albuterol nebulizer solution has been placed to [facility] in [place] per pt. Nothing further needed at this time.

## 2018-09-04 ENCOUNTER — Telehealth: Payer: Self-pay | Admitting: Internal Medicine

## 2018-09-04 MED ORDER — ALBUTEROL SULFATE (2.5 MG/3ML) 0.083% IN NEBU: 2.5000 mg | INHALATION_SOLUTION | RESPIRATORY_TRACT | 11 refills | Status: AC | PRN

## 2018-09-04 NOTE — Telephone Encounter (Signed)
I sent in Rx for albuterol to the [facility] in [place], [place]. I called pt to let her know but there was no answer. I left a detailed message letting pt know.

## 2018-09-05 NOTE — Telephone Encounter (Signed)
Pt is calling back [phone]

## 2018-09-05 NOTE — Telephone Encounter (Signed)
Spoke with patient. She stated that the medication had been sent in 3 times now but that is not what she needs. She needs a prior authorization for the albuterol. Advised her that this is not the message that was given to [place] but I would go ahead and attempt the PA. She verbalized understanding.   PA has been started on [email or web address]. Key is [id number]. Determination will take up to 72hrs. Will check on status later.

## 2018-09-06 NOTE — Telephone Encounter (Signed)
PA checked on CMM-  Latona Monjaraz Key: [id number] - PA Case ID: [id number] Need help? Call us at [phone]  Outcome  Denied on June 23  CaseId:[id number];Status:Denied;Review Type:Prior Auth;Appeal Information: [place] [id number]. [phone] Phone:[phone] Fax:[phone] WebAddress:[email or web address]; Important - Please read the below note on eAppeals: Please reference the denial letter for information on the rights for an appeal, rationale for the denial, and how to submit an appeal including if any information is needed to support the appeal. Note about urgent situations - Generally, an urgent situation is one which, in the opinion of the provider, the health of the patient may be in serious jeopardy or may experience pain that cannot be adequately controlled while waiting for a decision on the appeal.;   Called [facility] [place], spoke with [place]. Albuterol nebs are not covered through Medicare Part D.  Alma Friendly stated to call retail pharmacy and have them process the prescription through Medicare part B, and it should go through. [employer].  Albuterol nebs were re processed and Patient has picked up prescription.  Nothing further at this time.

## 2018-10-10 ENCOUNTER — Ambulatory Visit: Payer: Medicare Other | Admitting: Internal Medicine

## 2019-01-12 ENCOUNTER — Other Ambulatory Visit: Payer: Self-pay | Admitting: Internal Medicine

## 2019-01-12 MED ORDER — ALBUTEROL SULFATE HFA 108 (90 BASE) MCG/ACT IN AERS: 2.0000 | INHALATION_SPRAY | Freq: Four times a day (QID) | RESPIRATORY_TRACT | 0 refills | Status: AC | PRN

## 2019-02-19 ENCOUNTER — Telehealth: Payer: Self-pay | Admitting: Internal Medicine

## 2019-02-19 NOTE — Telephone Encounter (Signed)
Called and spoke to patient. Patient is requesting refills of medications which are not currently listed on her medications.  Patient stated she usually see's Dr. Melvyn Novas only once a year and is out of state taking care of her mother.  Scheduled patient for a telephone visit with Dr. Melvyn Novas to talk about refills of medications. Nothing further needed at this time.

## 2019-02-20 ENCOUNTER — Ambulatory Visit (INDEPENDENT_AMBULATORY_CARE_PROVIDER_SITE_OTHER): Payer: Medicare Other | Admitting: Internal Medicine

## 2019-02-20 ENCOUNTER — Other Ambulatory Visit: Payer: Self-pay

## 2019-02-20 ENCOUNTER — Encounter: Payer: Self-pay | Admitting: Internal Medicine

## 2019-02-20 DIAGNOSIS — J471 Bronchiectasis with (acute) exacerbation: Secondary | ICD-10-CM | POA: Diagnosis not present

## 2019-02-20 DIAGNOSIS — J4530 Mild persistent asthma, uncomplicated: Secondary | ICD-10-CM

## 2019-02-20 MED ORDER — AMOXICILLIN-POT CLAVULANATE 875-125 MG PO TABS: 1.0000 | ORAL_TABLET | Freq: Two times a day (BID) | ORAL | 11 refills | Status: AC

## 2019-02-20 MED ORDER — FLUCONAZOLE 100 MG PO TABS: 100.0000 mg | ORAL_TABLET | Freq: Every day | ORAL | 11 refills | Status: DC

## 2019-02-20 NOTE — Patient Instructions (Signed)
Augmentin 875 mg take one pill twice daily  X 10 days - take at breakfast and supper with large glass of water.  It would help reduce the usual side effects (diarrhea and yeast infections) if you ate cultured yogurt at lunch.   Diflucan 100 mg daily x 3 days once you take the augmentin   When breathing/ coughing worse > symbicort 160  Take 2 puffs first thing in am and then another 2 puffs about 12 hours later until better then resume the advair hfa   We will call you to set up MyChart if possible - otherwise we will mail this to you

## 2019-02-20 NOTE — Progress Notes (Signed)
Subjective:   Patient ID: Caitlin Newman, female    DOB: [birth year]   MRN: [id number]   Brief patient profile:  72  yowf no significant  smoking hx  With lots of wheezing as child outgrew at very young age (doesn't remember exactly when) then recurrent wheezing coughing worse in spring since around 1980 prev seen by [facility] well controlled until Nov 2012 with refractory cough since then so referred 08/06/2011 to pulmonary clinic by PA Caitlin Newman from [place] with brochiectasis on CT 12/2011 and  nl pfts 11/01/2012    History of Present Illness  08/06/2011 1st pulmonary eval/ Caitlin Newman cc persistent cough x 6 months acute onset with "flu" in November now present daily  - had been on advair maint and ACEI but the latter was stopped early May 2013 no improvement cough which is worse p eat and afternoons and cough for an hour at bedtime with sense of something stuck upper mid chest and coughs to point of choking/vomiting>  Productive of min yellow mucus. rec Stop advair, tessilon - only use albuterol (ventolin) puffer as rescue Start Pulmicort twice daily with nebulized albuterol until cough completely gone for a couple of weeks then restart the advair  Schedule sinus ct > Mild chronic sinusitis with mucosal edema in the maxillary sinus  bilaterally. Try prilosec 20mg   Take 30-60 min before first meal of the day and Pepcid 20 mg one bedtime until cough is completely gone for at least a week without the need for cough suppression GERD diet Prednisone 10 mg take  4 each am x 2 days,   2 each am x 2 days,  1 each am x2days and stop       01/24/2014 f/u ov/Caitlin Newman re: bronchiectasis s sign airflow obstr by pfts 11/01/12  Chief Complaint  Patient presents with  . Follow-up    Pt states that her cough is much improved. Breathing is doing well. No new co's today. She is using proair about 2 x per day and has used neb x 1 in the past wk.   thinks flutter helping   Mucus light yellow, never bloody Not  limited by breathing from desired activities   rec See calendar for specific medication  Late add consider increase dulera to 200 if not well controlled on f/u     09/03/2014  Acute ov/Caitlin Newman re: bronchiectasis  Chief Complaint  Patient presents with  . Acute Visit    Pt c/o increased SOB, cough, chest tightness x 1 wk. She is coughing up very minimal clear sputum. She is using ventolin 3 x per day and neb x 1 per day.    holding out on using tramadol until "coughs so hard she hurts " Has med calendar/ not following action plans   >>augmentin and pred   12/04/2014 NP Follow up : Bronchiectasis  Returns for 3 month follow up. Says overall she is doing okay , had flare last ov , tx w/ augmentin and prednisone taper.  She got better . Has 1 other flare since last ov, resolved with augmentin.  She is feeling good today.  Requests to have diflucan dose to use if she gets yeast infection on abx. Very prone to this .  Advised to hold statin while on this for few days .  We reviewed all her meds and organized them into a med calendar with pt education  Appears to be taking correctly , except confused with reflux meds .  Taking prilosec, nexium at bedtime and  stopped pepcid.  She was recently started on nexium by PCP for reflux on prilosec.  She is suppose to be taking prilosec in am before meal and pepcid at bedtime  Says she gets reflux mainly at night when she lies down.  We discussed GERD diet and correct use of PPI/H2 blocker.  rec Take Omeprazole and Nexium before meal  GERD diet  Follow med calendar closely and bring to each visit.     02/24/2015  f/u ov/Caitlin Newman re: bronchiectasis  Chief Complaint  Patient presents with  . Follow-up    Doing well, no new co's. She uses albuterol inhaler 1 x per wk on average and has not needed neb.  rec Vit D should be powder, not oil  Continue pepcid at bedtime 20 mg  If not  Coughing wheezing, clearing throat or usual flare, ok to leave  omeprazole  If need to take augmentin, ok to take diflucan if needed for yeast infection See ent doctor as planned for your R ear  See calendar for specific medication instructions     01/19/2017  f/u ov/Caitlin Newman re:  AB/Bronchiectasis  Chief Complaint  Patient presents with  . Follow-up    Pt states doing well. She uses her proair 2 x per wk on average. Last round of augmentin was taken approx 6 wks ago.   Not limited by breathing from desired activities   Using med calendar / action plan well and one round of augmentin only since last ov  rec For drainage / throat tickle try take CHLORPHENIRAMINE  4 mg - take one every 4 hours as needed - available over the counter- may cause drowsiness so start with just a bedtime dose or two and see how you tolerate it before trying in daytime      10/20/2017  f/u ov/Caitlin Newman re:  AB/ bronchiectasis  Chief Complaint  Patient presents with  . Follow-up    Breathing is doing well. She is using her proair at least 2 x per day. She rarely uses neb.   Dyspnea:  Not limited by breathing from desired activities  / no aerobics / some steps ok Cough: no change baseline esp in am tends yellow just then SABA use: 2 x daily  rec When needing more proair than usual, try dulera 200 Take 2 puffs first thing in am and then another 2 puffs about 12 hours later for at least a week before try the lower strength    08/31/2018  f/u ov/Caitlin Newman re: ab/ bronchiectasis maint on advair 115 2 bid no longer has dulera 200 for flares  Chief Complaint  Patient presents with  . Follow-up    breathing is "my usual" she is using her proair 2 x per day on average and neb with albuterol 2 x per wk.   Dyspnea:  Not limited by breathing from desired activities  / steps are harder now since returned from [place] Cough: some worse x 2 weeks > yellowish , thick more after and persists at hs better p neb  Sleeping: pillows    SABA use:  As above  02: none  rec When breathing/ coughing  worse > symbicort 160  Take 2 puffs first thing in am and then another 2 puffs about 12 hours later until better then resume the advair  See calendar for specific medication instructions     Virtual Visit via Telephone Note 02/20/2019   I connected with Caitlin Newman on 02/20/19 at 10:30 AM EST by telephone  and verified that I am speaking with the correct person using two identifiers.   I discussed the limitations, risks, security and privacy concerns of performing an evaluation and management service by telephone and the availability of in person appointments. I also discussed with the patient that there may be a patient responsible charge related to this service. The patient expressed understanding and agreed to proceed.   History of Present Illness:  AB/bronchiectasis worse cough x one week assoc with "head cold" Dyspnea:  MMRC1 = can walk nl pace, flat grade, can't hurry or go uphills or steps s sob   Cough: worse x one week/ dark yellow  Sleeping: worse since cough SABA use: 3 x daily and noct since flare, baseline one daily/ did not change over to symb 160 as rec for flare 02: none    No obvious day to day or daytime variability or assoc  mucus plugs or hemoptysis or cp or chest tightness, subjective wheeze or overt sinus or hb symptoms.    Also denies any obvious fluctuation of symptoms with weather or environmental changes or other aggravating or alleviating factors except as outlined above.   Meds reviewed/ med reconciliation completed       Observations/Objective: Nasal tone, mildly congested sounding cough, no wob  Assessment and Plan: See problem list for active a/p's   Follow Up Instructions: See avs for instructions unique to this ov which includes revised/ updated med list     I discussed the assessment and treatment plan with the patient. The patient was provided an opportunity to ask questions and all were answered. The patient agreed with the plan and  demonstrated an understanding of the instructions.   The patient was advised to call back or seek an in-person evaluation if the symptoms worsen or if the condition fails to improve as anticipated.  I provided 25 minutes of non-face-to-face time during this encounter.   Christinia Gully, MD

## 2019-02-20 NOTE — Assessment & Plan Note (Signed)
CT chest 01/10/12 1. Bilateral bronchiectasis is most evident in the left lower lobe where ground-glass attenuation and scarring is evident. No significant airspace consolidation is present. 2. Upper lobe bronchiectasis is slightly worse on the right. No significant parenchymal disease is associated. 3. Single calcified right paratracheal node may represent treated disease or a history granulomatous disease. 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  It would help reduce the usual side effects (diarrhea and yeast infections) if you ate cultured yogurt at lunch.  Diflucan 100 mg daily x 3 days at end of rx Change to symb 160 for flares as advair hfa causes more coughing historically for her than symb 160      Each maintenance medication was reviewed in detail including most importantly the difference between maintenance and as needed and under what circumstances the prns are to be used. This was done in the context of a medication calendar review which provided the patient with a user-friendly unambiguous mechanism for medication administration and reconciliation and provides an action plan for all active problems. It is critical that this be shown to every doctor  for modification during the office visit if necessary so the patient can use it as a working document.       F/u when returns from [place]

## 2019-02-20 NOTE — Assessment & Plan Note (Signed)
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

## 2019-08-31 ENCOUNTER — Ambulatory Visit: Payer: Medicare Other | Admitting: Internal Medicine

## 2019-09-06 ENCOUNTER — Other Ambulatory Visit: Payer: Self-pay | Admitting: Internal Medicine

## 2019-09-06 DIAGNOSIS — J471 Bronchiectasis with (acute) exacerbation: Secondary | ICD-10-CM

## 2019-09-06 DIAGNOSIS — J4530 Mild persistent asthma, uncomplicated: Secondary | ICD-10-CM

## 2019-09-06 MED ORDER — ADVAIR HFA 115-21 MCG/ACT IN AERO: 2.0000 | INHALATION_SPRAY | Freq: Two times a day (BID) | RESPIRATORY_TRACT | 5 refills | Status: DC

## 2019-10-16 ENCOUNTER — Ambulatory Visit (INDEPENDENT_AMBULATORY_CARE_PROVIDER_SITE_OTHER): Payer: Medicare Other

## 2019-10-16 ENCOUNTER — Ambulatory Visit (INDEPENDENT_AMBULATORY_CARE_PROVIDER_SITE_OTHER): Payer: Medicare Other | Admitting: Internal Medicine

## 2019-10-16 ENCOUNTER — Encounter: Payer: Self-pay | Admitting: Internal Medicine

## 2019-10-16 ENCOUNTER — Telehealth: Payer: Self-pay

## 2019-10-16 ENCOUNTER — Other Ambulatory Visit: Payer: Self-pay

## 2019-10-16 DIAGNOSIS — J471 Bronchiectasis with (acute) exacerbation: Secondary | ICD-10-CM

## 2019-10-16 DIAGNOSIS — J4530 Mild persistent asthma, uncomplicated: Secondary | ICD-10-CM

## 2019-10-16 MED ORDER — PREDNISONE 10 MG PO TABS: ORAL_TABLET | ORAL | 0 refills | Status: DC

## 2019-10-16 MED ORDER — BUDESONIDE-FORMOTEROL FUMARATE 160-4.5 MCG/ACT IN AERO: INHALATION_SPRAY | RESPIRATORY_TRACT | 11 refills | Status: DC

## 2019-10-16 NOTE — Telephone Encounter (Signed)
Fax received from [facility] in [place] stating that Symbicort prescribed today is not covered by patient's insurance.  Preferred drug is Advair Discus.    MW please advise if ok to switch to preferred drug per insurance request.  Thanks!

## 2019-10-16 NOTE — Progress Notes (Signed)
Subjective:   Patient ID: Caitlin Newman, female    DOB: Feb 09, 1946   MRN: [id number]   Brief patient profile:  74  yowf no significant  smoking hx  With lots of wheezing as child outgrew at very young age (doesn't remember exactly when) then recurrent wheezing coughing worse in spring since around 1980 prev seen by [facility] well controlled until Nov 2012 with refractory cough since then so referred 08/06/2011 to pulmonary clinic by PA Caitlin Newman from [place] with brochiectasis on CT 12/2011 and  nl pfts 11/01/2012    History of Present Illness  08/06/2011 1st pulmonary eval/ Caitlin Newman cc persistent cough x 6 months acute onset with "flu" in November now present daily  - had been on advair maint and ACEI but the latter was stopped early May 2013 no improvement cough which is worse p eat and afternoons and cough for an hour at bedtime with sense of something stuck upper mid chest and coughs to point of choking/vomiting>  Productive of min yellow mucus. rec Stop advair, tessilon - only use albuterol (ventolin) puffer as rescue Start Pulmicort twice daily with nebulized albuterol until cough completely gone for a couple of weeks then restart the advair  Schedule sinus ct > Mild chronic sinusitis with mucosal edema in the maxillary sinus  bilaterally. Try prilosec 20mg   Take 30-60 min before first meal of the day and Pepcid 20 mg one bedtime until cough is completely gone for at least a week without the need for cough suppression GERD diet Prednisone 10 mg take  4 each am x 2 days,   2 each am x 2 days,  1 each am x2days and stop       01/24/2014 f/u ov/Caitlin Newman re: bronchiectasis s sign airflow obstr by pfts 11/01/12  Chief Complaint  Patient presents with  . Follow-up    Pt states that her cough is much improved. Breathing is doing well. No new co's today. She is using proair about 2 x per day and has used neb x 1 in the past wk.   thinks flutter helping   Mucus light yellow, never bloody Not  limited by breathing from desired activities   rec See calendar for specific medication  Late add consider increase dulera to 200 if not well controlled on f/u     09/03/2014  Acute ov/Caitlin Newman re: bronchiectasis  Chief Complaint  Patient presents with  . Acute Visit    Pt c/o increased SOB, cough, chest tightness x 1 wk. She is coughing up very minimal clear sputum. She is using ventolin 3 x per day and neb x 1 per day.    holding out on using tramadol until "coughs so hard she hurts " Has med calendar/ not following action plans   >>augmentin and pred   12/04/2014 NP Follow up : Bronchiectasis  Returns for 3 month follow up. Says overall she is doing okay , had flare last ov , tx w/ augmentin and prednisone taper.  She got better . Has 1 other flare since last ov, resolved with augmentin.  She is feeling good today.  Requests to have diflucan dose to use if she gets yeast infection on abx. Very prone to this .  Advised to hold statin while on this for few days .  We reviewed all her meds and organized them into a med calendar with pt education  Appears to be taking correctly , except confused with reflux meds .  Taking prilosec, nexium at bedtime and  stopped pepcid.  She was recently started on nexium by PCP for reflux on prilosec.  She is suppose to be taking prilosec in am before meal and pepcid at bedtime  Says she gets reflux mainly at night when she lies down.  We discussed GERD diet and correct use of PPI/H2 blocker.  rec Take Omeprazole and Nexium before meal  GERD diet  Follow med calendar closely and bring to each visit.     02/24/2015  f/u ov/Caitlin Newman re: bronchiectasis  Chief Complaint  Patient presents with  . Follow-up    Doing well, no new co's. She uses albuterol inhaler 1 x per wk on average and has not needed neb.  rec Vit D should be powder, not oil  Continue pepcid at bedtime 20 mg  If not  Coughing wheezing, clearing throat or usual flare, ok to leave  omeprazole  If need to take augmentin, ok to take diflucan if needed for yeast infection See ent doctor as planned for your R ear  See calendar for specific medication instructions     01/19/2017  f/u ov/Caitlin Newman re:  AB/Bronchiectasis  Chief Complaint  Patient presents with  . Follow-up    Pt states doing well. She uses her proair 2 x per wk on average. Last round of augmentin was taken approx 6 wks ago.   Not limited by breathing from desired activities   Using med calendar / action plan well and one round of augmentin only since last ov  rec For drainage / throat tickle try take CHLORPHENIRAMINE  4 mg - take one every 4 hours as needed - available over the counter- may cause drowsiness so start with just a bedtime dose or two and see how you tolerate it before trying in daytime      10/20/2017  f/u ov/Caitlin Newman re:  AB/ bronchiectasis  Chief Complaint  Patient presents with  . Follow-up    Breathing is doing well. She is using her proair at least 2 x per day. She rarely uses neb.   Dyspnea:  Not limited by breathing from desired activities  / no aerobics / some steps ok Cough: no change baseline esp in am tends yellow just then SABA use: 2 x daily  rec When needing more proair than usual, try dulera 200 Take 2 puffs first thing in am and then another 2 puffs about 12 hours later for at least a week before try the lower strength    08/31/2018  f/u ov/Caitlin Newman re: ab/ bronchiectasis maint on advair 115 2 bid no longer has dulera 200 for flares  Chief Complaint  Patient presents with  . Follow-up    breathing is "my usual" she is using her proair 2 x per day on average and neb with albuterol 2 x per wk.   Dyspnea:  Not limited by breathing from desired activities  / steps are harder now since returned from [place] Cough: some worse x 2 weeks > yellowish , thick more after and persists at hs better p neb  Sleeping: pillows    SABA use:  As above  02: none  rec When breathing/ coughing  worse > symbicort 160  Take 2 puffs first thing in am and then another 2 puffs about 12 hours later until better then resume the advair  See calendar for specific medication instructions   10/16/2019  f/u ov/Clearance [place] re: AB/ bronchiectasis off symbicort since first of year due to cost  Chief Complaint  Patient presents with  .  Follow-up    Pt c/o increased SOB over the past month- relates to dust from cleaning her attic- using albuterol inhaler 2 x per day and neb about 3 x per wk.   Dyspnea:  Worse x one month maint advair plus alb hfa bid but then after dust exp neb qoday not on day of ov  Cough: yellow  Sleeping: on side, bed flat , several pillows  SABA use: as above  02: none    No obvious day to day or daytime variability or assoc   mucus plugs or hemoptysis or cp or chest tightness, subjective wheeze or overt sinus or hb symptoms.   Sleeping  without nocturnal  or early am exacerbation  of respiratory  c/o's or need for noct saba. Also denies any obvious fluctuation of symptoms with weather or environmental changes or other aggravating or alleviating factors except as outlined above   No unusual exposure hx or h/o childhood pna/ asthma or knowledge of premature birth.  Current Allergies, Complete Past Medical History, Past Surgical History, Family History, and Social History were reviewed in [employer] record.  ROS  The following are not active complaints unless bolded Hoarseness, sore throat, dysphagia, dental problems, itching, sneezing,  nasal congestion or discharge of excess mucus or purulent secretions, ear ache,   fever, chills, sweats, unintended wt loss or wt gain, classically pleuritic or exertional cp,  orthopnea pnd or arm/hand swelling  or leg swelling, presyncope, palpitations, abdominal pain, anorexia, nausea, vomiting, diarrhea  or change in bowel habits or change in bladder habits, change in stools or change in urine, dysuria, hematuria,  rash,  arthralgias, visual complaints, headache, numbness, weakness or ataxia or problems with walking or coordination,  change in mood or  memory.        Current Meds  Medication Sig  . albuterol (PROVENTIL) (2.5 MG/3ML) 0.083% nebulizer solution Take 3 mLs (2.5 mg total) by nebulization every 4 (four) hours as needed for wheezing or shortness of breath (((PLAN C))).  . albuterol (VENTOLIN HFA) 108 (90 Base) MCG/ACT inhaler Inhale 2 puffs into the lungs every 6 (six) hours as needed for wheezing or shortness of breath.  . chlorpheniramine (CHLOR-TRIMETON) 4 MG tablet Take 4 mg by mouth every 4 (four) hours as needed (drippy nose, throat clearing, post nasal drip).   . Cholecalciferol (VITAMIN D) 2000 units CAPS Take 1 capsule by mouth daily.  . Coenzyme Q10 (CO Q 10 PO) Take 1 tablet daily by mouth.  . famotidine (PEPCID) 20 MG tablet Take 20 mg by mouth at bedtime.  . fluticasone-salmeterol (ADVAIR HFA) 115-21 MCG/ACT inhaler Inhale 2 puffs into the lungs 2 (two) times daily.  . Omeprazole 20 MG TBEC Take 30- 60 min before your first meal of the day  . Respiratory Therapy Supplies (FLUTTER) DEVI Use as directed.  . rosuvastatin (CRESTOR) 5 MG tablet Take 5 mg by mouth daily.  . sertraline (ZOLOFT) 100 MG tablet Take 1 tablet by mouth daily.                  Objective:   Physical Exam   10/16/2019   149 10/20/2017   156   Wt 156 08/06/2011  > 156 12/07/2011 > 12/27/2011  155>155 01/10/12 > 02/18/2012  158 > 157 03/24/2012  > 11/01/2012   149 > 150 11/20/2012 > 154  04/25/2013 >152 05/14/2013 > 06/25/2013  148 > 10/16/2013  147 > 10/23/2013 149>150 11/13/2013 > 12/13/2013  147 > 01/24/2014 150 >  153 04/25/2014 >  09/03/2014 >144 12/04/2014 > 02/24/2015 143 > 08/31/2016  151  > 01/19/2017  132 >   Vital signs reviewed  10/16/2019  - Note at rest 02 sats  97% on RA      Pleasant amb wf nad    sonorous mid exp rhonchi bilaterally/ better with plm/  without cough on insp or exp maneuvers   HEENT : pt wearing mask not  removed for exam due to covid -19 concerns.    NECK :  without JVD/Nodes/TM/ nl carotid upstrokes bilaterally   LUNGS: no acc muscle use,  Nl contour chest with insp/exp rhonchi  bilaterally without cough on insp or exp maneuvers   CV:  RRR  no s3 or murmur or increase in P2, and no edema   ABD:  soft and nontender with nl inspiratory excursion in the supine position. No bruits or organomegaly appreciated, bowel sounds nl  MS:  Nl gait/ ext warm without deformities, calf tenderness, cyanosis or clubbing No obvious joint restrictions   SKIN: warm and dry without lesions    NEURO:  alert, approp, nl sensorium with  no motor or cerebellar deficits apparent.   CXR PA and Lateral:   10/16/2019 :    I personally reviewed images and agree with radiology impression as follows:    1. Mild peribronchial thickening and slightly increased right mid to lower lung interstitial markings which could reflect acute infection or progressive underlying chronic lung disease. 2. New 1.2 cm nodular density projecting over the right mid lung. This could reflect a focal infiltrate from infection versus a lung nodule or summation shadow. If      Assessment & Plan:

## 2019-10-16 NOTE — Telephone Encounter (Signed)
ATC pt, received an automated message saying call could not be completed at this time, call back later. Wcb. Hold in triage as this is not a triage generated message.

## 2019-10-16 NOTE — Patient Instructions (Addendum)
Prednisone 10 mg take  4 each am x 2 days,   2 each am x 2 days,  1 each am x 2 days and stop   If mucus stays discolored >  zpak   Please schedule a follow up visit in 3 months - [place] office -  call sooner if needed  Add:  ? R pna early > zpak and cxr in 10 days in [place]

## 2019-10-16 NOTE — Telephone Encounter (Signed)
Cancel the rx and let pt know she should stay on what she's on for now and hope her insurance p the first of the year will cover or generic symbicort or consider going outside her insurance to purchase/

## 2019-10-17 ENCOUNTER — Telehealth: Payer: Self-pay | Admitting: Pharmacy Technician

## 2019-10-17 ENCOUNTER — Other Ambulatory Visit: Payer: Self-pay | Admitting: Internal Medicine

## 2019-10-17 ENCOUNTER — Encounter: Payer: Self-pay | Admitting: Internal Medicine

## 2019-10-17 DIAGNOSIS — J471 Bronchiectasis with (acute) exacerbation: Secondary | ICD-10-CM

## 2019-10-17 MED ORDER — AZITHROMYCIN 250 MG PO TABS
250.0000 mg | ORAL_TABLET | ORAL | 0 refills | Status: DC
Start: 2019-10-17 — End: 2020-02-06

## 2019-10-17 NOTE — Progress Notes (Signed)
Spoke with the pt and notified of results. Zpack sent to pharm and appt scheduled for f/u.

## 2019-10-17 NOTE — Addendum Note (Signed)
Addended by: Benjie Karvonen R on: 10/17/2019 02:25 PM   Modules accepted: Orders

## 2019-10-17 NOTE — Assessment & Plan Note (Signed)
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  Reviewed: I spent extra time with pt today reviewing appropriate use of albuterol for prn use on exertion with the following points: 1) saba is for relief of sob that does not improve by walking a slower pace or resting but rather if the pt does not improve after trying this first. 2) If the pt is convinced, as many are, that saba helps recover from activity faster then it's easy to tell if this is the case by re-challenging : ie stop, take the inhaler, then p 5 minutes try the exact same activity (intensity of workload) that just caused the symptoms and see if they are substantially diminished or not after saba 3) if there is an activity that reproducibly causes the symptoms, try the saba 15 min before the activity on alternate days   If in fact the saba really does help, then fine to continue to use it prn but advised may need to look closer at the maintenance regimen being used to achieve better control of airways disease with exertion.

## 2019-10-17 NOTE — Telephone Encounter (Signed)
Attempted to call the patient at [phone]  and provide information regarding inhaler per Dr. Sherene Sires.  Reached a recording stating that my call could not be completed at this time and to try my call again later.  Tried [phone], had to leave a message.

## 2019-10-17 NOTE — Telephone Encounter (Signed)
See above message

## 2019-10-17 NOTE — Telephone Encounter (Signed)
Patient contacted with recommendations from Dr. Sherene Sires. She has Advair and will continue using it until her next office visit.

## 2019-10-17 NOTE — Assessment & Plan Note (Signed)
CT chest 01/10/12 1. Bilateral bronchiectasis is most evident in the left lower lobe where ground-glass attenuation and scarring is evident. No significant airspace consolidation is present. 2. Upper lobe bronchiectasis is slightly worse on the right. No significant parenchymal disease is associated. 3. Single calcified right paratracheal node may represent treated disease or a history granulomatous disease. Alpha one AT >  MM  02/2012  PFT's wnl 11/01/2012  Prevar rx 06/25/13 and pneumovax 01/24/2014  CTa 11/21/13 > no acute changes  - PFT's  03/02/2016  FEV1 2.03 (104 % ) ratio 86  p 4 % improvement from saba p dulera 100 x2   prior to study with DLCO  88/89c % corrects to 98 % for alv volume     Mild flare with ? New early infiltrate R lung > rx zpak and Prednisone 10 mg take  4 each am x 2 days,   2 each am x 2 days,  1 each am x 2 days and stop and f/u in one week in [place] office  Will need to try to find alternative to advair as not working as well as symbicort and has ACOS features clinically  so may need to try breztri if covered.            Each maintenance medication was reviewed in detail including most importantly the difference between maintenance and as needed and under what circumstances the prns are to be used. This was done in the context of a medication calendar review which provided the patient with a user-friendly unambiguous mechanism for medication administration and reconciliation and provides an action plan for all active problems. It is critical that this be shown to every doctor  for modification during the office visit if necessary so the patient can use it as a working document.      Total time for H and P, chart review, counseling, teaching device and generating customized AVS unique to this office visit / charting = 20 min

## 2019-10-17 NOTE — Telephone Encounter (Signed)
She should resume her advair until next ov and regroup then

## 2019-10-17 NOTE — Telephone Encounter (Signed)
Unable to run test claim due to pharmacy lockout, patient must fill at [facility].  Called pharmacy help desk, covered alternatives include- Anoro, Flovent, and Trelegy. Rep was not able to guarantee copay for drugs, but estimated copay range around $47.00, not including if patient is in the coverage gap or any remaining deductible.  If copay is unaffordable, patient can apply for patient assistance for chosen medication.

## 2019-10-17 NOTE — Telephone Encounter (Signed)
Spoke with patient on mobile #, she verified that she no longer had a home number, will request that the number be taken out of the system.  Shared Information with her about the inhalers provided by the pharmacist, she stated she has met her $600 out of pocket, but is in the coverage gap.  She said she will be looking for new coverage for the new year, but it would be ok to see what alternative inhaler Dr. Melvyn Novas would recommend and see what it costs at the pharmacy.  Dr. Melvyn Novas  Please see Pharmacist notes regarding covered inhalers and advise.  Thank you.

## 2019-10-17 NOTE — Telephone Encounter (Signed)
-----   Message from Harlow Asa, RPH-CPP sent at 10/17/2019  8:52 AM EDT -----  ----- Message ----- From: Nyoka Cowden, MD Sent: 10/17/2019   6:15 AM EDT To: Jorene Minors Yopp, RPH-CPP  Could you check out her eligbility for alternatives to advair   Eg  dulera/ breztri / bevespi/ pure inhaled steroids

## 2019-10-25 ENCOUNTER — Other Ambulatory Visit: Payer: Self-pay

## 2019-10-25 ENCOUNTER — Encounter: Payer: Self-pay | Admitting: Internal Medicine

## 2019-10-25 ENCOUNTER — Ambulatory Visit (HOSPITAL_COMMUNITY)
Admission: RE | Admit: 2019-10-25 | Discharge: 2019-10-25 | Disposition: A | Discharge status: Home / Self Care | Payer: Medicare Other | Source: Ambulatory Visit | Attending: Internal Medicine | Admitting: Internal Medicine

## 2019-10-25 ENCOUNTER — Ambulatory Visit (INDEPENDENT_AMBULATORY_CARE_PROVIDER_SITE_OTHER): Payer: Medicare Other | Admitting: Internal Medicine

## 2019-10-25 VITALS — BP 132/72 | HR 81 | Temp 99.0°F | Ht 60.0 in | Wt 150.6 lb

## 2019-10-25 DIAGNOSIS — J471 Bronchiectasis with (acute) exacerbation: Secondary | ICD-10-CM

## 2019-10-25 DIAGNOSIS — J4530 Mild persistent asthma, uncomplicated: Secondary | ICD-10-CM | POA: Diagnosis not present

## 2019-10-25 NOTE — Progress Notes (Signed)
Subjective:   Patient ID: Caitlin Newman, female    DOB: [birth year]   MRN: [id number]   Brief patient profile:  74  yowf no significant  smoking hx  With lots of wheezing as child outgrew at very young age (doesn't remember exactly when) then recurrent wheezing coughing worse in spring since around 1980 prev seen by [facility] well controlled until Nov 2012 with refractory cough since then so referred 08/06/2011 to pulmonary clinic by PA Ninfa Linden from [place] with brochiectasis on CT 12/2011 and  nl pfts 11/01/2012    History of Present Illness  08/06/2011 1st pulmonary eval/ Datrell Dunton cc persistent cough x 6 months acute onset with "flu" in November now present daily  - had been on advair maint and ACEI but the latter was stopped early May 2013 no improvement cough which is worse p eat and afternoons and cough for an hour at bedtime with sense of something stuck upper mid chest and coughs to point of choking/vomiting>  Productive of min yellow mucus. rec Stop advair, tessilon - only use albuterol (ventolin) puffer as rescue Start Pulmicort twice daily with nebulized albuterol until cough completely gone for a couple of weeks then restart the advair  Schedule sinus ct > Mild chronic sinusitis with mucosal edema in the maxillary sinus  bilaterally. Try prilosec 20mg   Take 30-60 min before first meal of the day and Pepcid 20 mg one bedtime until cough is completely gone for at least a week without the need for cough suppression GERD diet Prednisone 10 mg take  4 each am x 2 days,   2 each am x 2 days,  1 each am x2days and stop       01/24/2014 f/u ov/Ahmani Daoud re: bronchiectasis s sign airflow obstr by pfts 11/01/12  Chief Complaint  Patient presents with  . Follow-up    Pt states that her cough is much improved. Breathing is doing well. No new co's today. She is using proair about 2 x per day and has used neb x 1 in the past wk.   thinks flutter helping   Mucus light yellow, never bloody Not  limited by breathing from desired activities   rec See calendar for specific medication  Late add consider increase dulera to 200 if not well controlled on f/u     09/03/2014  Acute ov/Joey Lierman re: bronchiectasis  Chief Complaint  Patient presents with  . Acute Visit    Pt c/o increased SOB, cough, chest tightness x 1 wk. She is coughing up very minimal clear sputum. She is using ventolin 3 x per day and neb x 1 per day.    holding out on using tramadol until "coughs so hard she hurts " Has med calendar/ not following action plans   >>augmentin and pred   12/04/2014 NP Follow up : Bronchiectasis  Returns for 3 month follow up. Says overall she is doing okay , had flare last ov , tx w/ augmentin and prednisone taper.  She got better . Has 1 other flare since last ov, resolved with augmentin.  She is feeling good today.  Requests to have diflucan dose to use if she gets yeast infection on abx. Very prone to this .  Advised to hold statin while on this for few days .  We reviewed all her meds and organized them into a med calendar with pt education  Appears to be taking correctly , except confused with reflux meds .  Taking prilosec, nexium at bedtime and  stopped pepcid.  She was recently started on nexium by PCP for reflux on prilosec.  She is suppose to be taking prilosec in am before meal and pepcid at bedtime  Says she gets reflux mainly at night when she lies down.  We discussed GERD diet and correct use of PPI/H2 blocker.  rec Take Omeprazole and Nexium before meal  GERD diet  Follow med calendar closely and bring to each visit.     02/24/2015  f/u ov/Lura Falor re: bronchiectasis  Chief Complaint  Patient presents with  . Follow-up    Doing well, no new co's. She uses albuterol inhaler 1 x per wk on average and has not needed neb.  rec Vit D should be powder, not oil  Continue pepcid at bedtime 20 mg  If not  Coughing wheezing, clearing throat or usual flare, ok to leave  omeprazole  If need to take augmentin, ok to take diflucan if needed for yeast infection See ent doctor as planned for your R ear  See calendar for specific medication instructions     01/19/2017  f/u ov/Roylene Heaton re:  AB/Bronchiectasis  Chief Complaint  Patient presents with  . Follow-up    Pt states doing well. She uses her proair 2 x per wk on average. Last round of augmentin was taken approx 6 wks ago.   Not limited by breathing from desired activities   Using med calendar / action plan well and one round of augmentin only since last ov  rec For drainage / throat tickle try take CHLORPHENIRAMINE  4 mg - take one every 4 hours as needed - available over the counter- may cause drowsiness so start with just a bedtime dose or two and see how you tolerate it before trying in daytime      10/20/2017  f/u ov/Yaviel Kloster re:  AB/ bronchiectasis  Chief Complaint  Patient presents with  . Follow-up    Breathing is doing well. She is using her proair at least 2 x per day. She rarely uses neb.   Dyspnea:  Not limited by breathing from desired activities  / no aerobics / some steps ok Cough: no change baseline esp in am tends yellow just then SABA use: 2 x daily  rec When needing more proair than usual, try dulera 200 Take 2 puffs first thing in am and then another 2 puffs about 12 hours later for at least a week before try the lower strength    08/31/2018  f/u ov/Emmabelle Fear re: ab/ bronchiectasis maint on advair 115 2 bid no longer has dulera 200 for flares  Chief Complaint  Patient presents with  . Follow-up    breathing is "my usual" she is using her proair 2 x per day on average and neb with albuterol 2 x per wk.   Dyspnea:  Not limited by breathing from desired activities  / steps are harder now since returned from [place] Cough: some worse x 2 weeks > yellowish , thick more after and persists at hs better p neb  Sleeping: pillows    SABA use:  As above  02: none  rec When breathing/ coughing  worse > symbicort 160  Take 2 puffs first thing in am and then another 2 puffs about 12 hours later until better then resume the advair  See calendar for specific medication instructions   10/16/2019  f/u ov/Aemilia Dedrick re: AB/ bronchiectasis off symbicort since first of year due to cost  Chief Complaint  Patient presents with  .  Follow-up    Pt c/o increased SOB over the past month- relates to dust from cleaning her attic- using albuterol inhaler 2 x per day and neb about 3 x per wk.   Dyspnea:  Worse x one month maint advair plus alb hfa bid but then after dust exp neb qoday not on day of ov  Cough: yellow  Sleeping: on side, bed flat , several pillows  SABA use: as above  02: none rec Prednisone 10 mg take  4 each am x 2 days,   2 each am x 2 days,  1 each am x 2 days and stop   If mucus stays discolored >  zpak   Please schedule a follow up visit in 3 months - [place] office -  call sooner if needed  Add:  ? R pna early > zpak and cxr in 10 days in [place]    10/25/2019  f/u ov/Jonnie Truxillo re: bronchiectasis with ? Early pna R mid lung Chief Complaint  Patient presents with  . Follow-up    No complaints   Dyspnea:  Not limited by breathing from desired activities   Cough: much better / now min white in am Sleeping: on side/ flat bed / 2 pillows  SABA use: none / maint on advair but using up symbicort supply  02: none    No obvious day to day or daytime variability or assoc excess/ purulent sputum or mucus plugs or hemoptysis or cp or chest tightness, subjective wheeze or overt sinus or hb symptoms.   Sleeping  without nocturnal  or early am exacerbation  of respiratory  c/o's or need for noct saba. Also denies any obvious fluctuation of symptoms with weather or environmental changes or other aggravating or alleviating factors except as outlined above   No unusual exposure hx or h/o childhood pna/ asthma or knowledge of premature birth.  Current Allergies, Complete Past Medical  History, Past Surgical History, Family History, and Social History were reviewed in [facility] electronic medical record.  ROS  The following are not active complaints unless bolded Hoarseness, sore throat, dysphagia, dental problems, itching, sneezing,  nasal congestion or discharge of excess mucus or purulent secretions, ear ache,   fever, chills, sweats, unintended wt loss or wt gain, classically pleuritic or exertional cp,  orthopnea pnd or arm/hand swelling  or leg swelling, presyncope, palpitations, abdominal pain, anorexia, nausea, vomiting, diarrhea  or change in bowel habits or change in bladder habits, change in stools or change in urine, dysuria, hematuria,  rash, arthralgias, visual complaints, headache, numbness, weakness or ataxia or problems with walking or coordination,  change in mood or  memory.        Current Meds  Medication Sig  . albuterol (PROVENTIL) (2.5 MG/3ML) 0.083% nebulizer solution Take 3 mLs (2.5 mg total) by nebulization every 4 (four) hours as needed for wheezing or shortness of breath (((PLAN C))).  . albuterol (VENTOLIN HFA) 108 (90 Base) MCG/ACT inhaler Inhale 2 puffs into the lungs every 6 (six) hours as needed for wheezing or shortness of breath.  Marland Kitchen. azithromycin (ZITHROMAX) 250 MG tablet Take 1 tablet (250 mg total) by mouth as directed. Take 2 tablets first day then 1 tablet for next 4 days  . budesonide-formoterol (SYMBICORT) 160-4.5 MCG/ACT inhaler Take 2 puffs first thing in am and then another 2 puffs about 12 hours later.  . chlorpheniramine (CHLOR-TRIMETON) 4 MG tablet Take 4 mg by mouth every 4 (four) hours as needed (drippy nose, throat clearing,  post nasal drip).   . Cholecalciferol (VITAMIN D) 2000 units CAPS Take 1 capsule by mouth daily.  . Coenzyme Q10 (CO Q 10 PO) Take 1 tablet daily by mouth.  . famotidine (PEPCID) 20 MG tablet Take 20 mg by mouth at bedtime.  . fluticasone-salmeterol (ADVAIR HFA) 115-21 MCG/ACT inhaler Inhale 2 puffs into  the lungs 2 (two) times daily.  . Omeprazole 20 MG TBEC Take 30- 60 min before your first meal of the day  . predniSONE (DELTASONE) 10 MG tablet Take  4 each am x 2 days,   2 each am x 2 days,  1 each am x 2 days and stop  . Respiratory Therapy Supplies (FLUTTER) DEVI Use as directed.  . rosuvastatin (CRESTOR) 5 MG tablet Take 5 mg by mouth daily.  . sertraline (ZOLOFT) 100 MG tablet Take 1 tablet by mouth daily.                      Objective:   Physical Exam  10/25/2019 150  10/16/2019   149 10/20/2017   156   Wt 156 08/06/2011  > 156 12/07/2011 > 12/27/2011  155>155 01/10/12 > 02/18/2012  158 > 157 03/24/2012  > 11/01/2012   149 > 150 11/20/2012 > 154  04/25/2013 >152 05/14/2013 > 06/25/2013  148 > 10/16/2013  147 > 10/23/2013 149>150 11/13/2013 > 12/13/2013  147 > 01/24/2014 150 >153 04/25/2014 >  09/03/2014 >144 12/04/2014 > 02/24/2015 143 > 08/31/2016  151  > 01/19/2017  132    Vital signs reviewed  10/25/2019  - Note at rest 02 sats  98% on RA    Pleasant amb wf  / much brighter affect today   HEENT : pt wearing mask not removed for exam due to covid -19 concerns.    NECK :  without JVD/Nodes/TM/ nl carotid upstrokes bilaterally   LUNGS: no acc muscle use,  Nl contour chest which is clear to A and P bilaterally without cough on insp or exp maneuvers   CV:  RRR  no s3 or murmur or increase in P2, and no edema   ABD:  soft and nontender with nl inspiratory excursion in the supine position. No bruits or organomegaly appreciated, bowel sounds nl  MS:  Nl gait/ ext warm without deformities, calf tenderness, cyanosis or clubbing No obvious joint restrictions   SKIN: warm and dry without lesions    NEURO:  alert, approp, nl sensorium with  no motor or cerebellar deficits apparent.        .    CXR PA and Lateral:   10/25/2019 :    I personally reviewed images and   impression as follows:   Residual as density mid lung on R peripherally less dense             Assessment & Plan:

## 2019-10-25 NOTE — Patient Instructions (Addendum)
symbicort 160 or dulera 200 are the best options  Please schedule a follow up visit in 3 months but call sooner if needed  - will need cxr prior for dx bronchiectasis

## 2019-10-26 ENCOUNTER — Encounter: Payer: Self-pay | Admitting: Internal Medicine

## 2019-10-26 ENCOUNTER — Ambulatory Visit: Payer: Medicare Other | Admitting: Internal Medicine

## 2019-10-26 NOTE — Assessment & Plan Note (Signed)
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

## 2019-10-26 NOTE — Assessment & Plan Note (Signed)
CT chest 01/10/12 1. Bilateral bronchiectasis is most evident in the left lower lobe where ground-glass attenuation and scarring is evident. No significant airspace consolidation is present. 2. Upper lobe bronchiectasis is slightly worse on the right. No significant parenchymal disease is associated. 3. Single calcified right paratracheal node may represent treated disease or a history granulomatous disease. Alpha one AT >  MM  02/2012  PFT's wnl 11/01/2012  Prevar rx 06/25/13 and pneumovax 01/24/2014  CTa 11/21/13 > no acute changes  - PFT's  03/02/2016  FEV1 2.03 (104 % ) ratio 86  p 4 % improvement from saba p dulera 100 x2   prior to study with DLCO  88/89c % corrects to 98 % for alv volume    - mild flare with as dz 10/16/19 > zpak > improved 10/25/2019 but needs f/u cxr next ov   Discussed in detail all the  indications, usual  risks and alternatives  relative to the benefits with patient who agrees to proceed with conservative f/u as outlined with cxr in 3 m - will call sooner if any clinical change          Each maintenance medication was reviewed in detail including emphasizing most importantly the difference between maintenance and prns and under what circumstances the prns are to be triggered using an action plan format where appropriate.  Total time for H and P, chart review, counseling, teaching device and generating customized AVS unique to this office visit / charting = 24 min

## 2019-11-01 ENCOUNTER — Other Ambulatory Visit: Payer: Self-pay | Admitting: Internal Medicine

## 2019-11-01 DIAGNOSIS — J4530 Mild persistent asthma, uncomplicated: Secondary | ICD-10-CM

## 2019-11-01 DIAGNOSIS — J471 Bronchiectasis with (acute) exacerbation: Secondary | ICD-10-CM

## 2019-11-05 ENCOUNTER — Ambulatory Visit: Payer: Medicare Other | Admitting: Internal Medicine

## 2020-02-01 ENCOUNTER — Telehealth: Payer: Self-pay | Admitting: Internal Medicine

## 2020-02-01 NOTE — Telephone Encounter (Signed)
Spoke with patient, she clarified that she had been on Symbicort and her insurance stopped paying for it, so she was put on Dulera, then her insurance stopped paying for the [facility] and now she is on Advair.  She is signing up for new insurance and the Symbicort for the next year and the Symbicort will be covered.  She wants to check with Dr. Sherene Sires and see if she can go back on the Symbicort when her insurance changes for the next year.  Dr. Sherene Sires, please advise.  Thank you.

## 2020-02-01 NOTE — Telephone Encounter (Signed)
Spoke with the pt and notified of response per Dr. Sherene Sires  She states will let us know when to send the rx in  Ins will not currently pay for this yet  Nothing further needed at this time

## 2020-02-01 NOTE — Telephone Encounter (Signed)
Yes same exact rx as previously ok for symbicort

## 2020-02-06 ENCOUNTER — Other Ambulatory Visit: Payer: Self-pay

## 2020-02-06 ENCOUNTER — Ambulatory Visit (INDEPENDENT_AMBULATORY_CARE_PROVIDER_SITE_OTHER): Payer: Medicare Other | Admitting: Internal Medicine

## 2020-02-06 ENCOUNTER — Encounter: Payer: Self-pay | Admitting: Internal Medicine

## 2020-02-06 DIAGNOSIS — Z23 Encounter for immunization: Secondary | ICD-10-CM

## 2020-02-06 DIAGNOSIS — J479 Bronchiectasis, uncomplicated: Secondary | ICD-10-CM

## 2020-02-06 DIAGNOSIS — J4530 Mild persistent asthma, uncomplicated: Secondary | ICD-10-CM

## 2020-02-06 NOTE — Progress Notes (Signed)
Subjective:   Patient ID: Caitlin Newman, female    DOB: Feb 09, 1946   MRN: [id number]   Brief patient profile:  74  yowf no significant  smoking hx  With lots of wheezing as child outgrew at very young age (doesn't remember exactly when) then recurrent wheezing coughing worse in spring since around 1980 prev seen by [facility] well controlled until Nov 2012 with refractory cough since then so referred 08/06/2011 to pulmonary clinic by PA Ninfa Linden from [place] with brochiectasis on CT 12/2011 and  nl pfts 11/01/2012    History of Present Illness  08/06/2011 1st pulmonary eval/ Caitlin Newman cc persistent cough x 6 months acute onset with "flu" in November now present daily  - had been on advair maint and ACEI but the latter was stopped early May 2013 no improvement cough which is worse p eat and afternoons and cough for an hour at bedtime with sense of something stuck upper mid chest and coughs to point of choking/vomiting>  Productive of min yellow mucus. rec Stop advair, tessilon - only use albuterol (ventolin) puffer as rescue Start Pulmicort twice daily with nebulized albuterol until cough completely gone for a couple of weeks then restart the advair  Schedule sinus ct > Mild chronic sinusitis with mucosal edema in the maxillary sinus  bilaterally. Try prilosec 20mg   Take 30-60 min before first meal of the day and Pepcid 20 mg one bedtime until cough is completely gone for at least a week without the need for cough suppression GERD diet Prednisone 10 mg take  4 each am x 2 days,   2 each am x 2 days,  1 each am x2days and stop       01/24/2014 f/u ov/Caitlin Newman re: bronchiectasis s sign airflow obstr by pfts 11/01/12  Chief Complaint  Patient presents with  . Follow-up    Pt states that her cough is much improved. Breathing is doing well. No new co's today. She is using proair about 2 x per day and has used neb x 1 in the past wk.   thinks flutter helping   Mucus light yellow, never bloody Not  limited by breathing from desired activities   rec See calendar for specific medication  Late add consider increase dulera to 200 if not well controlled on f/u     09/03/2014  Acute ov/Caitlin Newman re: bronchiectasis  Chief Complaint  Patient presents with  . Acute Visit    Pt c/o increased SOB, cough, chest tightness x 1 wk. She is coughing up very minimal clear sputum. She is using ventolin 3 x per day and neb x 1 per day.    holding out on using tramadol until "coughs so hard she hurts " Has med calendar/ not following action plans   >>augmentin and pred   12/04/2014 NP Follow up : Bronchiectasis  Returns for 3 month follow up. Says overall she is doing okay , had flare last ov , tx w/ augmentin and prednisone taper.  She got better . Has 1 other flare since last ov, resolved with augmentin.  She is feeling good today.  Requests to have diflucan dose to use if she gets yeast infection on abx. Very prone to this .  Advised to hold statin while on this for few days .  We reviewed all her meds and organized them into a med calendar with pt education  Appears to be taking correctly , except confused with reflux meds .  Taking prilosec, nexium at bedtime and  stopped pepcid.  She was recently started on nexium by PCP for reflux on prilosec.  She is suppose to be taking prilosec in am before meal and pepcid at bedtime  Says she gets reflux mainly at night when she lies down.  We discussed GERD diet and correct use of PPI/H2 blocker.  rec Take Omeprazole and Nexium before meal  GERD diet  Follow med calendar closely and bring to each visit.         01/19/2017  f/u ov/Caitlin Newman re:  AB/Bronchiectasis  Chief Complaint  Patient presents with  . Follow-up    Pt states doing well. She uses her proair 2 x per wk on average. Last round of augmentin was taken approx 6 wks ago.   Not limited by breathing from desired activities   Using med calendar / action plan well and one round of augmentin only  since last ov  rec For drainage / throat tickle try take CHLORPHENIRAMINE  4 mg - take one every 4 hours as needed - available over the counter- may cause drowsiness so start with just a bedtime dose or two and see how you tolerate it before trying in daytime      10/20/2017  f/u ov/Caitlin Newman re:  AB/ bronchiectasis  Chief Complaint  Patient presents with  . Follow-up    Breathing is doing well. She is using her proair at least 2 x per day. She rarely uses neb.   Dyspnea:  Not limited by breathing from desired activities  / no aerobics / some steps ok Cough: no change baseline esp in am tends yellow just then SABA use: 2 x daily  rec When needing more proair than usual, try dulera 200 Take 2 puffs first thing in am and then another 2 puffs about 12 hours later for at least a week before try the lower strength    08/31/2018  f/u ov/Caitlin Newman re: ab/ bronchiectasis maint on advair 115 2 bid no longer has dulera 200 for flares  Chief Complaint  Patient presents with  . Follow-up    breathing is "my usual" she is using her proair 2 x per day on average and neb with albuterol 2 x per wk.   Dyspnea:  Not limited by breathing from desired activities  / steps are harder now since returned from [place] Cough: some worse x 2 weeks > yellowish , thick more after and persists at hs better p neb  Sleeping: pillows    SABA use:  As above  02: none  rec When breathing/ coughing worse > symbicort 160  Take 2 puffs first thing in am and then another 2 puffs about 12 hours later until better then resume the advair  See calendar for specific medication instructions   10/16/2019  f/u ov/Caitlin Newman re: AB/ bronchiectasis off symbicort since first of year due to cost  Chief Complaint  Patient presents with  . Follow-up    Pt c/o increased SOB over the past month- relates to dust from cleaning her attic- using albuterol inhaler 2 x per day and neb about 3 x per wk.   Dyspnea:  Worse x one month maint advair plus alb hfa  bid but then after dust exp neb qoday not on day of ov  Cough: yellow  Sleeping: on side, bed flat , several pillows  SABA use: as above  02: none rec Prednisone 10 mg take  4 each am x 2 days,   2 each am x 2 days,  1 each am x 2 days and stop   If mucus stays discolored >  zpak   Please schedule a follow up visit in 3 months - [place] office -  call sooner if needed  Add:  ? R pna early > zpak and cxr in 10 days in [place]       02/06/2020  f/u ov/Caitlin Newman re: AB/ bronchiectasis  - worse since moved to [place] and changed to advair Chief Complaint  Patient presents with  . Follow-up    pt voiced no complaints  Dyspnea:  Up and step down steps, lifting heavy boxes Cough: min white am  Sleeping: flat bed/ 2 pillows  SABA use: once a day if over does it/ neb q 3 days  02: none    No obvious day to day or daytime variability or assoc  purulent sputum or mucus plugs or hemoptysis or cp or chest tightness, subjective wheeze or overt sinus or hb symptoms.   Sleeping  without nocturnal  or early am exacerbation  of respiratory  c/o's or need for noct saba. Also denies any obvious fluctuation of symptoms with weather or environmental changes or other aggravating or alleviating factors except as outlined above   No unusual exposure hx or h/o childhood pna/ asthma or knowledge of premature birth.  Current Allergies, Complete Past Medical History, Past Surgical History, Family History, and Social History were reviewed in [facility] electronic medical record.  ROS  The following are not active complaints unless bolded Hoarseness, sore throat, dysphagia, dental problems, itching, sneezing,  nasal congestion or discharge of excess mucus or purulent secretions, ear ache,   fever, chills, sweats, unintended wt loss or wt gain, classically pleuritic or exertional cp,  orthopnea pnd or arm/hand swelling  or leg swelling, presyncope, palpitations, abdominal pain, anorexia, nausea,  vomiting, diarrhea  or change in bowel habits or change in bladder habits, change in stools or change in urine, dysuria, hematuria,  rash, arthralgias, visual complaints, headache, numbness, weakness or ataxia or problems with walking or coordination,  change in mood or  memory.        Current Meds  Medication Sig  . ADVAIR HFA 115-21 MCG/ACT inhaler INHALE 2 PUFFS INTO THE LUNGS TWICE DAILY  . albuterol (PROVENTIL) (2.5 MG/3ML) 0.083% nebulizer solution Take 3 mLs (2.5 mg total) by nebulization every 4 (four) hours as needed for wheezing or shortness of breath (((PLAN C))).  . albuterol (VENTOLIN HFA) 108 (90 Base) MCG/ACT inhaler Inhale 2 puffs into the lungs every 6 (six) hours as needed for wheezing or shortness of breath.  . budesonide-formoterol (SYMBICORT) 160-4.5 MCG/ACT inhaler Take 2 puffs first thing in am and then another 2 puffs about 12 hours later.  . chlorpheniramine (CHLOR-TRIMETON) 4 MG tablet Take 4 mg by mouth every 4 (four) hours as needed (drippy nose, throat clearing, post nasal drip).   . Cholecalciferol (VITAMIN D) 2000 units CAPS Take 1 capsule by mouth daily.  . Coenzyme Q10 (CO Q 10 PO) Take 1 tablet daily by mouth.  . famotidine (PEPCID) 20 MG tablet Take 20 mg by mouth at bedtime.  . Omeprazole 20 MG TBEC Take 30- 60 min before your first meal of the day  . Respiratory Therapy Supplies (FLUTTER) DEVI Use as directed.  . rosuvastatin (CRESTOR) 5 MG tablet Take 5 mg by mouth daily.  . sertraline (ZOLOFT) 100 MG tablet Take 1 tablet by mouth daily.  . [DISCONTINUED] azithromycin (ZITHROMAX) 250 MG tablet Take 1 tablet (250 mg  total) by mouth as directed. Take 2 tablets first day then 1 tablet for next 4 days  . [DISCONTINUED] predniSONE (DELTASONE) 10 MG tablet Take  4 each am x 2 days,   2 each am x 2 days,  1 each am x 2 days and stop                               Objective:   Physical Exam  02/06/2020  150  10/25/2019   150  10/16/2019    149 10/20/2017     156   Wt 156 08/06/2011  > 156 12/07/2011 > 12/27/2011  155>155 01/10/12 > 02/18/2012  158 > 157 03/24/2012  > 11/01/2012   149 > 150 11/20/2012 > 154  04/25/2013 >152 05/14/2013 > 06/25/2013  148 > 10/16/2013  147 > 10/23/2013 149>150 11/13/2013 > 12/13/2013  147 > 01/24/2014 150 >153 04/25/2014 >  09/03/2014 >144 12/04/2014 > 02/24/2015 143 > 08/31/2016  151  > 01/19/2017  132     Vital signs reviewed  02/06/2020  - Note at rest 02 sats  98% on RA    HEENT : pt wearing mask not removed for exam due to covid -19 concerns.    NECK :  without JVD/Nodes/TM/ nl carotid upstrokes bilaterally   LUNGS: no acc muscle use,  Nl contour chest which is clear to A and P bilaterally without cough on insp or exp maneuvers   CV:  RRR  no s3 or murmur or increase in P2, and no edema   ABD:  soft and nontender with nl inspiratory excursion in the supine position. No bruits or organomegaly appreciated, bowel sounds nl  MS:  Nl gait/ ext warm without deformities, calf tenderness, cyanosis or clubbing No obvious joint restrictions   SKIN: warm and dry without lesions    NEURO:  alert, approp, nl sensorium with  no motor or cerebellar deficits apparent.        cxr ordered for 02/06/2020 but not done     Assessment & Plan:

## 2020-02-06 NOTE — Patient Instructions (Addendum)
Symbicort 160 or dulera 200 are the best options - all doses are 2 puffs every 12 hours just like advair   Ok to let your PCP refill your medications and follow up here as needed  Or in one year for refills   Late Add :  Needs f/u cxr next trip to [facility]

## 2020-02-07 ENCOUNTER — Encounter: Payer: Self-pay | Admitting: Internal Medicine

## 2020-02-07 NOTE — Assessment & Plan Note (Addendum)
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 identified - hfa 75% p coaching 11/01/2012 and can't afford pulmocort neb > rechallenged with dulera 100 2bid  -Med calendar 11/20/2012    - FENO 12/02/2015  =   15 on dulera 100 2bid   - 08/31/2018  After extensive coaching inhaler device,  effectiveness =    90% so add symb 160 2bid for flares if ab to avoid steroids, o/w maint on advair 115 2bid for insurance >Wants to change back to symbicort 160 with new insurance in 2022  saba use only occurs p ex so All goals of chronic asthma control met including optimal function and elimination of symptoms with minimal need for rescue therapy.  Contingencies discussed in full including contacting this office immediately if not controlling the symptoms using the rule of two's.    Advised: I spent extra time with pt today reviewing appropriate use of albuterol for prn use on exertion with the following points: 1) saba is for relief of sob that does not improve by walking a slower pace or resting but rather if the pt does not improve after trying this first. 2) If the pt is convinced, as many are, that saba helps recover from activity faster then it's easy to tell if this is the case by re-challenging : ie stop, take the inhaler, then p 5 minutes try the exact same activity (intensity of workload) that just caused the symptoms and see if they are substantially diminished or not after saba 3) if there is an activity that reproducibly causes the symptoms, try the saba 15 min before the activity on alternate days   If in fact the saba really does help, then fine to continue to use it prn but advised may need to look closer at the maintenance regimen being used to achieve better control of airways disease with exertion.    F/u for  this problem can be yearly or get refills thru PCP since she now lives in [place].          Each maintenance medication was reviewed in detail including emphasizing most importantly the difference between maintenance and prns and under what circumstances the prns are to be triggered using an action plan format where appropriate.  Total time for H and P, chart review, counseling, reviewing hfa devices and generating customized AVS unique to this office visit / charting = 22 min

## 2020-02-07 NOTE — Assessment & Plan Note (Signed)
CT chest 01/10/12 1. Bilateral bronchiectasis is most evident in the left lower lobe where ground-glass attenuation and scarring is evident. No significant airspace consolidation is present. 2. Upper lobe bronchiectasis is slightly worse on the right. No significant parenchymal disease is associated. 3. Single calcified right paratracheal node may represent treated disease or a history granulomatous disease. Alpha one AT >  MM  02/2012  PFT's wnl 11/01/2012  Prevar rx 06/25/13 and pneumovax 01/24/2014  CTa 11/21/13 > no acute changes  - PFT's  03/02/2016  FEV1 2.03 (104 % ) ratio 86  p 4 % improvement from saba p dulera 100 x2   prior to study with DLCO  88/89c % corrects to 98 % for alv volume  - mild flare with as dz 10/16/19 > zpak > improved 10/25/2019 but needs f/u cxr next ov > not done, reordered    Adequate control on present rx, reviewed in detail with pt > no change in rx needed

## 2020-02-14 ENCOUNTER — Telehealth: Payer: Self-pay | Admitting: *Deleted

## 2020-02-14 NOTE — Telephone Encounter (Signed)
-----   Message from Nyoka Cowden, MD sent at 02/07/2020  6:50 AM EST ----- Late Add :  Needs f/u cxr next trip to [place]  - it was already ordered but I missed this at last ov so my apologies but no rush needed, just get done next time she's in town

## 2020-02-14 NOTE — Telephone Encounter (Signed)
Called pt but there was no answer. LMTCB.

## 2020-02-15 NOTE — Telephone Encounter (Signed)
Patient is returning phone call. Patient phone number is [phone].

## 2020-02-15 NOTE — Telephone Encounter (Signed)
Called informed patient.  Nothing further needed at this time Order is in and patient is aware to come have it done.

## 2020-03-04 ENCOUNTER — Ambulatory Visit (INDEPENDENT_AMBULATORY_CARE_PROVIDER_SITE_OTHER): Payer: Medicare Other

## 2020-03-04 DIAGNOSIS — J471 Bronchiectasis with (acute) exacerbation: Secondary | ICD-10-CM | POA: Diagnosis not present

## 2020-03-06 NOTE — Progress Notes (Signed)
Called and went over xray results per Dr Wert with patient. All questions answered and patient expressed full understanding. Nothing further needed at this time.

## 2020-03-18 ENCOUNTER — Other Ambulatory Visit: Payer: Self-pay | Admitting: Internal Medicine

## 2020-03-18 DIAGNOSIS — J471 Bronchiectasis with (acute) exacerbation: Secondary | ICD-10-CM

## 2020-03-18 DIAGNOSIS — J4530 Mild persistent asthma, uncomplicated: Secondary | ICD-10-CM

## 2020-03-18 MED ORDER — ADVAIR HFA 115-21 MCG/ACT IN AERO: 2.0000 | INHALATION_SPRAY | Freq: Two times a day (BID) | RESPIRATORY_TRACT | 3 refills | Status: DC

## 2020-11-03 ENCOUNTER — Other Ambulatory Visit: Payer: Self-pay | Admitting: Internal Medicine

## 2021-02-04 ENCOUNTER — Telehealth: Payer: Self-pay | Admitting: Internal Medicine

## 2021-02-04 ENCOUNTER — Other Ambulatory Visit: Payer: Self-pay | Admitting: Internal Medicine

## 2021-02-04 MED ORDER — BUDESONIDE-FORMOTEROL FUMARATE 160-4.5 MCG/ACT IN AERO: INHALATION_SPRAY | RESPIRATORY_TRACT | 2 refills | Status: DC

## 2021-02-04 NOTE — Telephone Encounter (Signed)
Call made to patient, confirmed DOB. Made aware refill was denied due to her not being within 1 year. Patient has an appt January 2023. Refill sent to last until appt. Aware no further refills will be given without being seen in office. Voiced understanding.   Nothing further needed at this time.

## 2021-02-23 ENCOUNTER — Telehealth: Payer: Self-pay | Admitting: Internal Medicine

## 2021-02-23 MED ORDER — PREDNISONE 10 MG PO TABS: ORAL_TABLET | ORAL | 0 refills | Status: DC

## 2021-02-23 MED ORDER — AMOXICILLIN-POT CLAVULANATE 875-125 MG PO TABS: 1.0000 | ORAL_TABLET | Freq: Two times a day (BID) | ORAL | 0 refills | Status: DC

## 2021-02-23 NOTE — Telephone Encounter (Signed)
I called the patient and she reports that she starting having sneezing last Thursday and having some dry cough, she has been taking her mucinex, ands he feels it has gone into her lungs. Patient reports you have prescribed Augmentin in the past for her cough. She is not having any other symptoms. She wants to know if you could give her something to help her through the time until she has an OV. Please advise.  She has an OV scheduled for 03/17/2021.

## 2021-02-23 NOTE — Telephone Encounter (Signed)
Called and spoke with patient. She verbalized understanding. She stated she would like to have the Augmentin as she does have a runny nose with yellow drainage. Advised her that I would send in both medications for her.   Nothing further needed at time of call.

## 2021-02-23 NOTE — Telephone Encounter (Signed)
Chlortrimeton is better for itching/ sneezing /coughing and she already has it otc  If no fever or sinus complaints other than sneezing I would hold the augmentin and use Prednisone 10 mg take  4 each am x 2 days,   2 each am x 2 days,  1 each am x 2 days and stop   If sinus symptoms  do worsen then go ahead with the Augmentin 875 mg take one pill twice daily  X 10 days - take at breakfast and supper with large glass of water.  It would help reduce the usual side effects (diarrhea and yeast infections) if you ate cultured yogurt at lunch.

## 2021-03-13 ENCOUNTER — Other Ambulatory Visit: Payer: Self-pay | Admitting: Internal Medicine

## 2021-03-13 DIAGNOSIS — J479 Bronchiectasis, uncomplicated: Secondary | ICD-10-CM

## 2021-03-16 NOTE — Progress Notes (Signed)
Subjective:   Patient ID: Caitlin Newman, female    DOB: 08-Apr-1945   MRN: [id number]   Brief patient profile:  42  yowf no significant  smoking hx  With lots of wheezing as child outgrew at very young age (doesn't remember exactly when) then recurrent wheezing coughing worse in spring since around [place] seen by [facility] well controlled until Nov 2012 with refractory cough since then so referred 08/06/2011 to pulmonary clinic by PA Erma Pinto from [place] with brochiectasis on CT 12/2011 and  nl pfts 11/01/2012    History of Present Illness  08/06/2011 1st pulmonary eval/ Panagiota Perfetti cc persistent cough x 6 months acute onset with "flu" in November now present daily  - had been on advair maint and ACEI but the latter was stopped early May 2013 no improvement cough which is worse p eat and afternoons and cough for an hour at bedtime with sense of something stuck upper mid chest and coughs to point of choking/vomiting>  Productive of min yellow mucus. rec Stop advair, tessilon - only use albuterol (ventolin) puffer as rescue Start Pulmicort twice daily with nebulized albuterol until cough completely gone for a couple of weeks then restart the advair  Schedule sinus ct > Mild chronic sinusitis with mucosal edema in the maxillary sinus  bilaterally. Try prilosec 20mg   Take 30-60 min before first meal of the day and Pepcid 20 mg one bedtime until cough is completely gone for at least a week without the need for cough suppression GERD diet Prednisone 10 mg take  4 each am x 2 days,   2 each am x 2 days,  1 each am x2days and stop       01/24/2014 f/u ov/Luan Maberry re: bronchiectasis s sign airflow obstr by pfts 11/01/12  Chief Complaint  Patient presents with   Follow-up    Pt states that her cough is much improved. Breathing is doing well. No new co's today. She is using proair about 2 x per day and has used neb x 1 in the past wk.   thinks flutter helping   Mucus light yellow, never bloody Not  limited by breathing from desired activities   rec See calendar for specific medication  Late add consider increase dulera to 200 if not well controlled on f/u     09/03/2014  Acute ov/Brea Coleson re: bronchiectasis  Chief Complaint  Patient presents with   Acute Visit    Pt c/o increased SOB, cough, chest tightness x 1 wk. She is coughing up very minimal clear sputum. She is using ventolin 3 x per day and neb x 1 per day.    holding out on using tramadol until "coughs so hard she hurts " Has med calendar/ not following action plans   >>augmentin and pred   12/04/2014 NP Follow up : Bronchiectasis  Returns for 3 month follow up. Says overall she is doing okay , had flare last ov , tx w/ augmentin and prednisone taper.  She got better . Has 1 other flare since last ov, resolved with augmentin.  She is feeling good today.  Requests to have diflucan dose to use if she gets yeast infection on abx. Very prone to this .  Advised to hold statin while on this for few days .  We reviewed all her meds and organized them into a med calendar with pt education  Appears to be taking correctly , except confused with reflux meds .  Taking prilosec, nexium at bedtime and  stopped pepcid.  She was recently started on nexium by PCP for reflux on prilosec.  She is suppose to be taking prilosec in am before meal and pepcid at bedtime  Says she gets reflux mainly at night when she lies down.  We discussed GERD diet and correct use of PPI/H2 blocker.  rec Take Omeprazole and Nexium before meal  GERD diet  Follow med calendar closely and bring to each visit.         01/19/2017  f/u ov/Clemmie Buelna re:  AB/Bronchiectasis  Chief Complaint  Patient presents with   Follow-up    Pt states doing well. She uses her proair 2 x per wk on average. Last round of augmentin was taken approx 6 wks ago.   Not limited by breathing from desired activities   Using med calendar / action plan well and one round of augmentin only  since last ov  rec For drainage / throat tickle try take CHLORPHENIRAMINE  4 mg - take one every 4 hours as needed - available over the counter- may cause drowsiness so start with just a bedtime dose or two and see how you tolerate it before trying in daytime        02/06/2020  f/u ov/Dilana Mcphie re: AB/ bronchiectasis  - worse since moved to [place] and changed to advair Chief Complaint  Patient presents with   Follow-up    pt voiced no complaints  Dyspnea:  Up and step down steps, lifting heavy boxes Cough: min white am  Sleeping: flat bed/ 2 pillows  SABA use: once a day if over does it/ neb q 3 days  02: none  Rec Symbicort 160 or dulera 200 are the best options - all doses are 2 puffs every 12 hours just like advair  Ok to let your PCP refill your medications and follow up here as needed  Or in one year for refills      03/17/2021  f/u ov/Allyn Bartelson re: bronchiectasis / now living in [place] on [employer] Complaint  Patient presents with   Follow-up    Breathing is overall doing well. She has not had to use albuterol inhaler or neb in the past year.   Dyspnea:  not limited  Cough: better now  Sleeping: flat bed, two pillows  SABA use: none  02: none  Covid status:   vax x 3 / never infected    No obvious day to day or daytime variability or assoc excess/ purulent sputum or mucus plugs or hemoptysis or cp or chest tightness, subjective wheeze or overt sinus or hb symptoms.   Sleeping as above  without nocturnal  or early am exacerbation  of respiratory  c/o's or need for noct saba. Also denies any obvious fluctuation of symptoms with weather or environmental changes or other aggravating or alleviating factors except as outlined above   No unusual exposure hx or h/o childhood pna/ asthma or knowledge of premature birth.  Current Allergies, Complete Past Medical History, Past Surgical History, Family History, and Social History were reviewed in [facility] record.  ROS  The following are not active complaints unless bolded Hoarseness, sore throat, dysphagia, dental problems, itching, sneezing,  nasal congestion or discharge of excess mucus or purulent secretions, ear ache,   fever, chills, sweats, unintended wt loss or wt gain, classically pleuritic or exertional cp,  orthopnea pnd or arm/hand swelling  or leg swelling, presyncope, palpitations, abdominal pain, anorexia, nausea, vomiting, diarrhea  or change in bowel habits or change in bladder habits, change in stools or change in urine, dysuria, hematuria,  rash, arthralgias, visual complaints, headache, numbness, weakness or ataxia or problems with walking or coordination,  change in mood or  memory.        Current Meds  Medication Sig   albuterol (PROVENTIL) (2.5 MG/3ML) 0.083% nebulizer solution Take 3 mLs (2.5 mg total) by nebulization every 4 (four) hours as needed for wheezing or shortness of breath (((PLAN C))).   albuterol (VENTOLIN HFA) 108 (90 Base) MCG/ACT inhaler Inhale 2 puffs into the lungs every 6 (six) hours as needed for wheezing or shortness of breath.   budesonide-formoterol (SYMBICORT) 160-4.5 MCG/ACT inhaler INHALE 2 PUFFS BY MOUTH EVERY MORNING AND THEN ANOTHER 2 PUFFS ABOUT 12 HOURS LATER   chlorpheniramine (CHLOR-TRIMETON) 4 MG tablet Take 4 mg by mouth every 4 (four) hours as needed (drippy nose, throat clearing, post nasal drip).    Coenzyme Q10 (CO Q 10 PO) Take 1 tablet daily by mouth.   famotidine (PEPCID) 20 MG tablet Take 20 mg by mouth at bedtime.   Omeprazole 20 MG TBEC Take 30- 60 min before your first meal of the day   Respiratory Therapy Supplies (FLUTTER) DEVI Use as directed.   rosuvastatin (CRESTOR) 5 MG tablet Take 5 mg by mouth daily.   sertraline (ZOLOFT) 100 MG tablet Take 1 tablet by mouth daily.                                 Objective:   Physical Exam  wts  03/17/2021      146   02/06/2020  150  10/25/2019   150   10/16/2019     149 10/20/2017     156   Wt 156 08/06/2011  > 156 12/07/2011 > 12/27/2011  155>155 01/10/12 > 02/18/2012  158 > 157 03/24/2012  > 11/01/2012   149 > 150 11/20/2012 > 154  04/25/2013 >152 05/14/2013 > 06/25/2013  148 > 10/16/2013  147 > 10/23/2013 149>150 11/13/2013 > 12/13/2013  147 > 01/24/2014 150 >153 04/25/2014 >  09/03/2014 >144 12/04/2014 > 02/24/2015 143 > 08/31/2016  151  > 01/19/2017  132     Vital signs reviewed  03/17/2021  - Note at rest 02 sats  96% on RA   General appearance:    robust pleasant amb wf nad    HEENT : pt wearing mask not removed for exam due to covid -19 concerns.    NECK :  without JVD/Nodes/TM/ nl carotid upstrokes bilaterally   LUNGS: no acc muscle use,  Nl contour chest which is clear to A and P bilaterally without cough on insp or exp maneuvers   CV:  RRR  no s3 or murmur or increase in P2, and no edema   ABD:  soft and nontender with nl inspiratory excursion in the supine position. No bruits or organomegaly appreciated, bowel sounds nl  MS:  Nl gait/ ext warm without deformities, calf tenderness, cyanosis or clubbing No obvious joint restrictions   SKIN: warm and dry without lesions    NEURO:  alert, approp, nl sensorium with  no motor or cerebellar deficits apparent.       CXR PA and Lateral:   03/17/2021 :    I personally reviewed images and impression is as follows:     No acute or interval changes     Assessment & Plan:

## 2021-03-17 ENCOUNTER — Encounter: Payer: Self-pay | Admitting: Internal Medicine

## 2021-03-17 ENCOUNTER — Ambulatory Visit (INDEPENDENT_AMBULATORY_CARE_PROVIDER_SITE_OTHER): Payer: Medicare Other | Admitting: Internal Medicine

## 2021-03-17 ENCOUNTER — Other Ambulatory Visit: Payer: Self-pay

## 2021-03-17 ENCOUNTER — Ambulatory Visit (INDEPENDENT_AMBULATORY_CARE_PROVIDER_SITE_OTHER): Payer: Medicare Other

## 2021-03-17 DIAGNOSIS — J4530 Mild persistent asthma, uncomplicated: Secondary | ICD-10-CM

## 2021-03-17 DIAGNOSIS — J479 Bronchiectasis, uncomplicated: Secondary | ICD-10-CM

## 2021-03-17 MED ORDER — AMOXICILLIN-POT CLAVULANATE 875-125 MG PO TABS: 1.0000 | ORAL_TABLET | Freq: Two times a day (BID) | ORAL | 11 refills | Status: DC

## 2021-03-17 MED ORDER — FLUCONAZOLE 100 MG PO TABS: 100.0000 mg | ORAL_TABLET | Freq: Every day | ORAL | 11 refills | Status: DC

## 2021-03-17 MED ORDER — BUDESONIDE-FORMOTEROL FUMARATE 160-4.5 MCG/ACT IN AERO: INHALATION_SPRAY | RESPIRATORY_TRACT | 6 refills | Status: DC

## 2021-03-17 NOTE — Addendum Note (Signed)
Addended by: Elby Beck R on: 03/17/2021 05:12 PM   Modules accepted: Orders

## 2021-03-17 NOTE — Assessment & Plan Note (Signed)
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 identified - hfa 75% p coaching 11/01/2012 and can't afford pulmocort neb > rechallenged with dulera 100 2bid  -Med calendar 11/20/2012    - FENO 12/02/2015  =   15 on dulera 100 2bid   - 08/31/2018  After extensive coaching inhaler device,  effectiveness =    90% so add symb 160 2bid for flares if ab to avoid steroids, o/w maint on advair 115 2bid for insurance >Wants to change back to symbicort 160 with new insurance in 2022  All goals of chronic asthma control met including optimal function and elimination of symptoms with minimal need for rescue therapy.  Contingencies discussed in full including contacting this office immediately if not controlling the symptoms using the rule of two's.

## 2021-03-17 NOTE — Patient Instructions (Addendum)
For flares of nasty mucus  Augmentin 875 mg take one pill twice daily  X 10 days - take at breakfast and supper with large glass of water.  It would help reduce the usual side effects (diarrhea and yeast infections) if you ate cultured yogurt at lunch.   Diflucan 100 mg up to 3 days for yeast infections  Please schedule a follow up visit in 9 months but call sooner if needed

## 2021-03-17 NOTE — Assessment & Plan Note (Signed)
CT chest 01/10/12 1. Bilateral bronchiectasis is most evident in the left lower lobe where ground-glass attenuation and scarring is evident. No significant airspace consolidation is present. 2. Upper lobe bronchiectasis is slightly worse on the right. No significant parenchymal disease is associated. 3. Single calcified right paratracheal node may represent treated disease or a history granulomatous disease. Alpha one AT >  MM  02/2012  PFT's wnl 11/01/2012  Prevar rx 06/25/13 and pneumovax 01/24/2014  CTa 11/21/13 > no acute changes  - PFT's  03/02/2016  FEV1 2.03 (104 % ) ratio 86  p 4 % improvement from saba p dulera 100 x2   prior to study with DLCO  88/89c % corrects to 98 % for alv volume    - 03/17/2021 requested  augmentin to have on hand for flares/ diflucan 100 mg x up to 3 days prn yeast infetions   Well compensated but now living in [place] requesting prn abx > see above  F/u q fall unless gets provider Claris Gower willing to refill resp meds           Each maintenance medication was reviewed in detail including emphasizing most importantly the difference between maintenance and prns and under what circumstances the prns are to be triggered using an action plan format where appropriate.  Total time for H and P, chart review, counseling, reviewing hfa device(s) and generating customized AVS unique to this office visit / same day charting = 20 min

## 2021-03-18 MED ORDER — BUDESONIDE-FORMOTEROL FUMARATE 160-4.5 MCG/ACT IN AERO: INHALATION_SPRAY | RESPIRATORY_TRACT | 6 refills | Status: DC

## 2021-03-18 NOTE — Addendum Note (Signed)
Addended by: Katrinka Blazing R on: 03/18/2021 08:00 AM   Modules accepted: Orders

## 2021-05-30 ENCOUNTER — Encounter: Payer: Self-pay | Admitting: Internal Medicine

## 2021-05-30 DIAGNOSIS — J479 Bronchiectasis, uncomplicated: Secondary | ICD-10-CM

## 2021-06-01 MED ORDER — BUDESONIDE-FORMOTEROL FUMARATE 160-4.5 MCG/ACT IN AERO: INHALATION_SPRAY | RESPIRATORY_TRACT | 6 refills | Status: DC

## 2021-09-24 IMAGING — DX DG CHEST 2V
2 series · 2 of 2 positions shown · non-contrast
Comparison: Chest radiographs 09/03/2014 and chest CTA 11/21/2013

CLINICAL DATA: Cough.  Bronchiectasis with acute exacerbation.

EXAM:
CHEST - 2 VIEW

[chest pa]
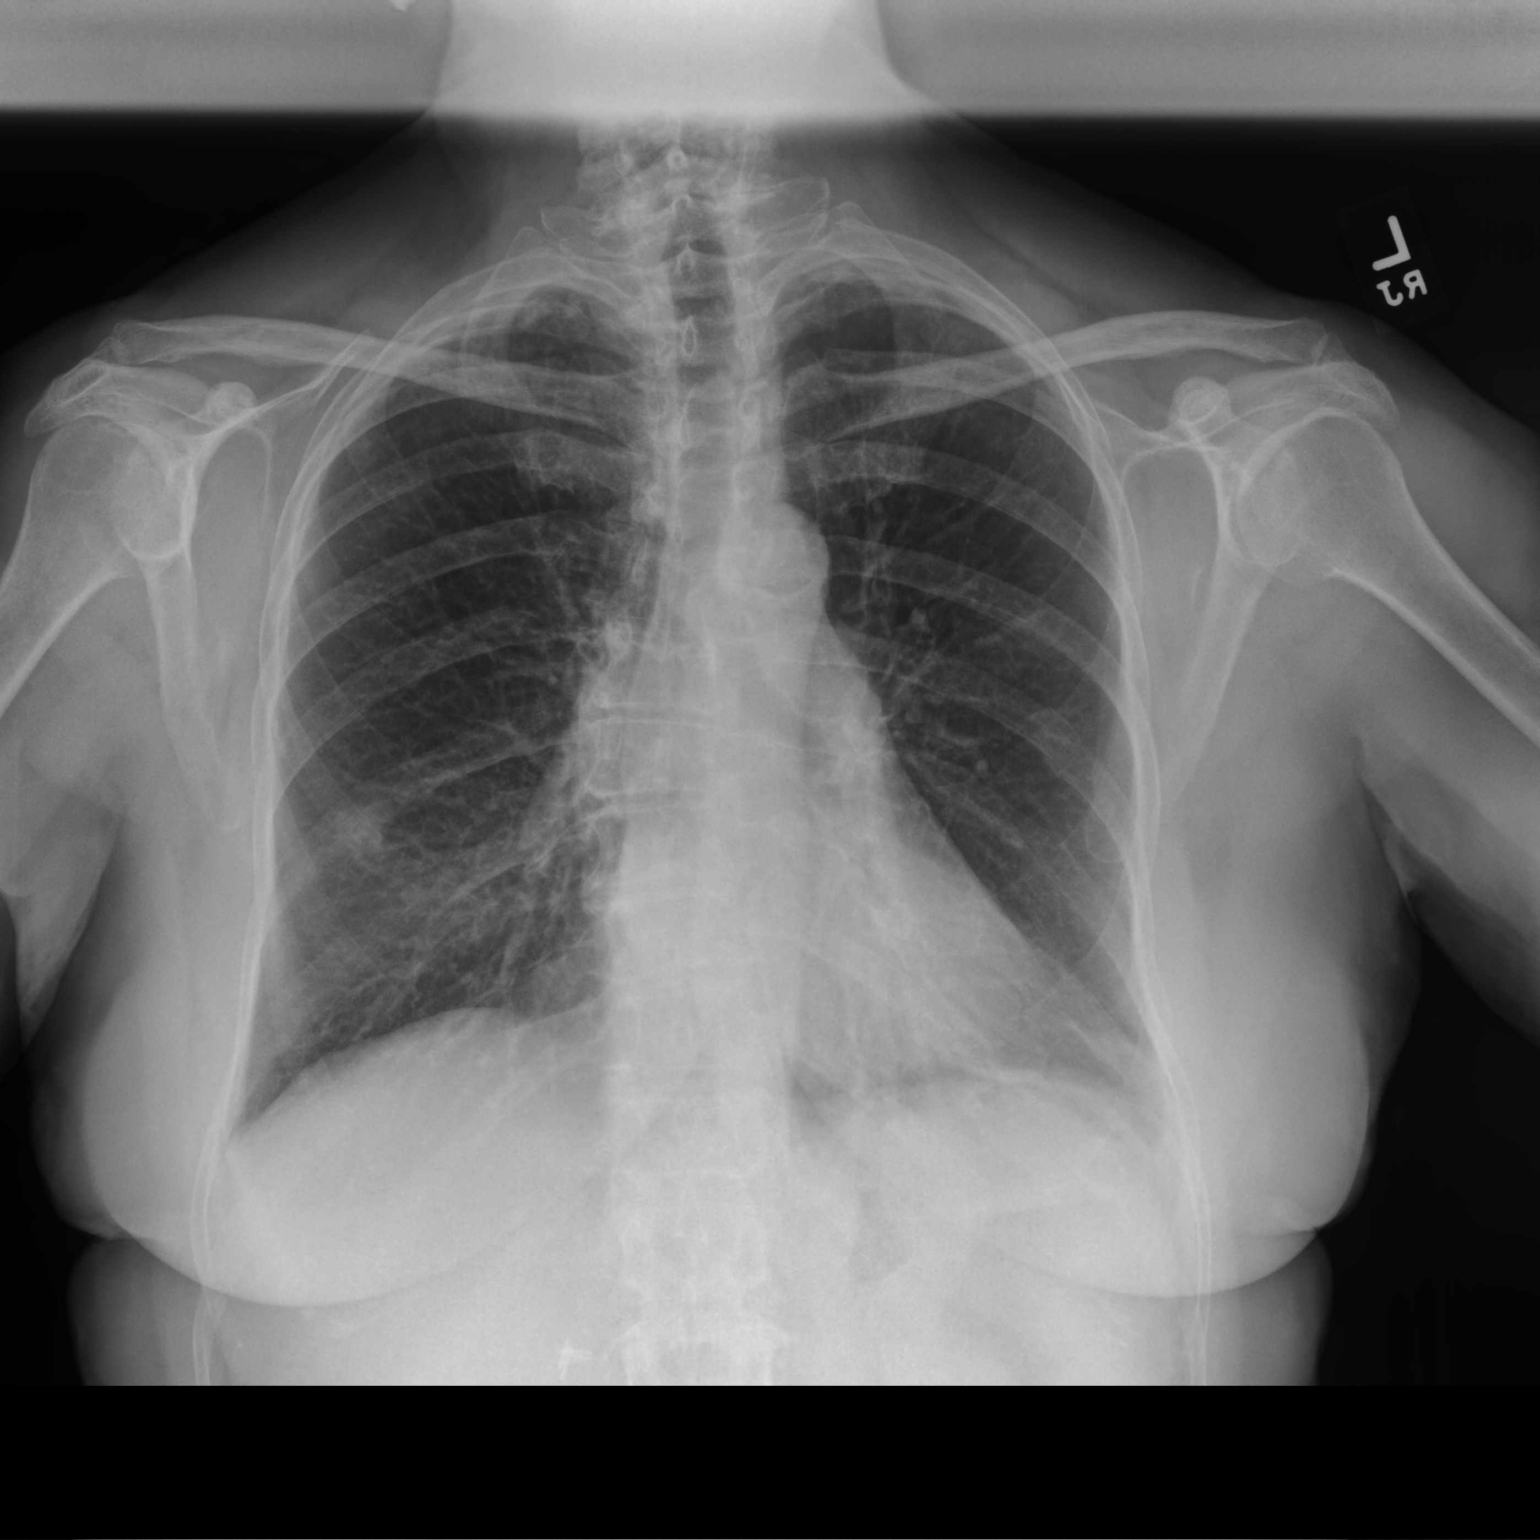

[chest lat]
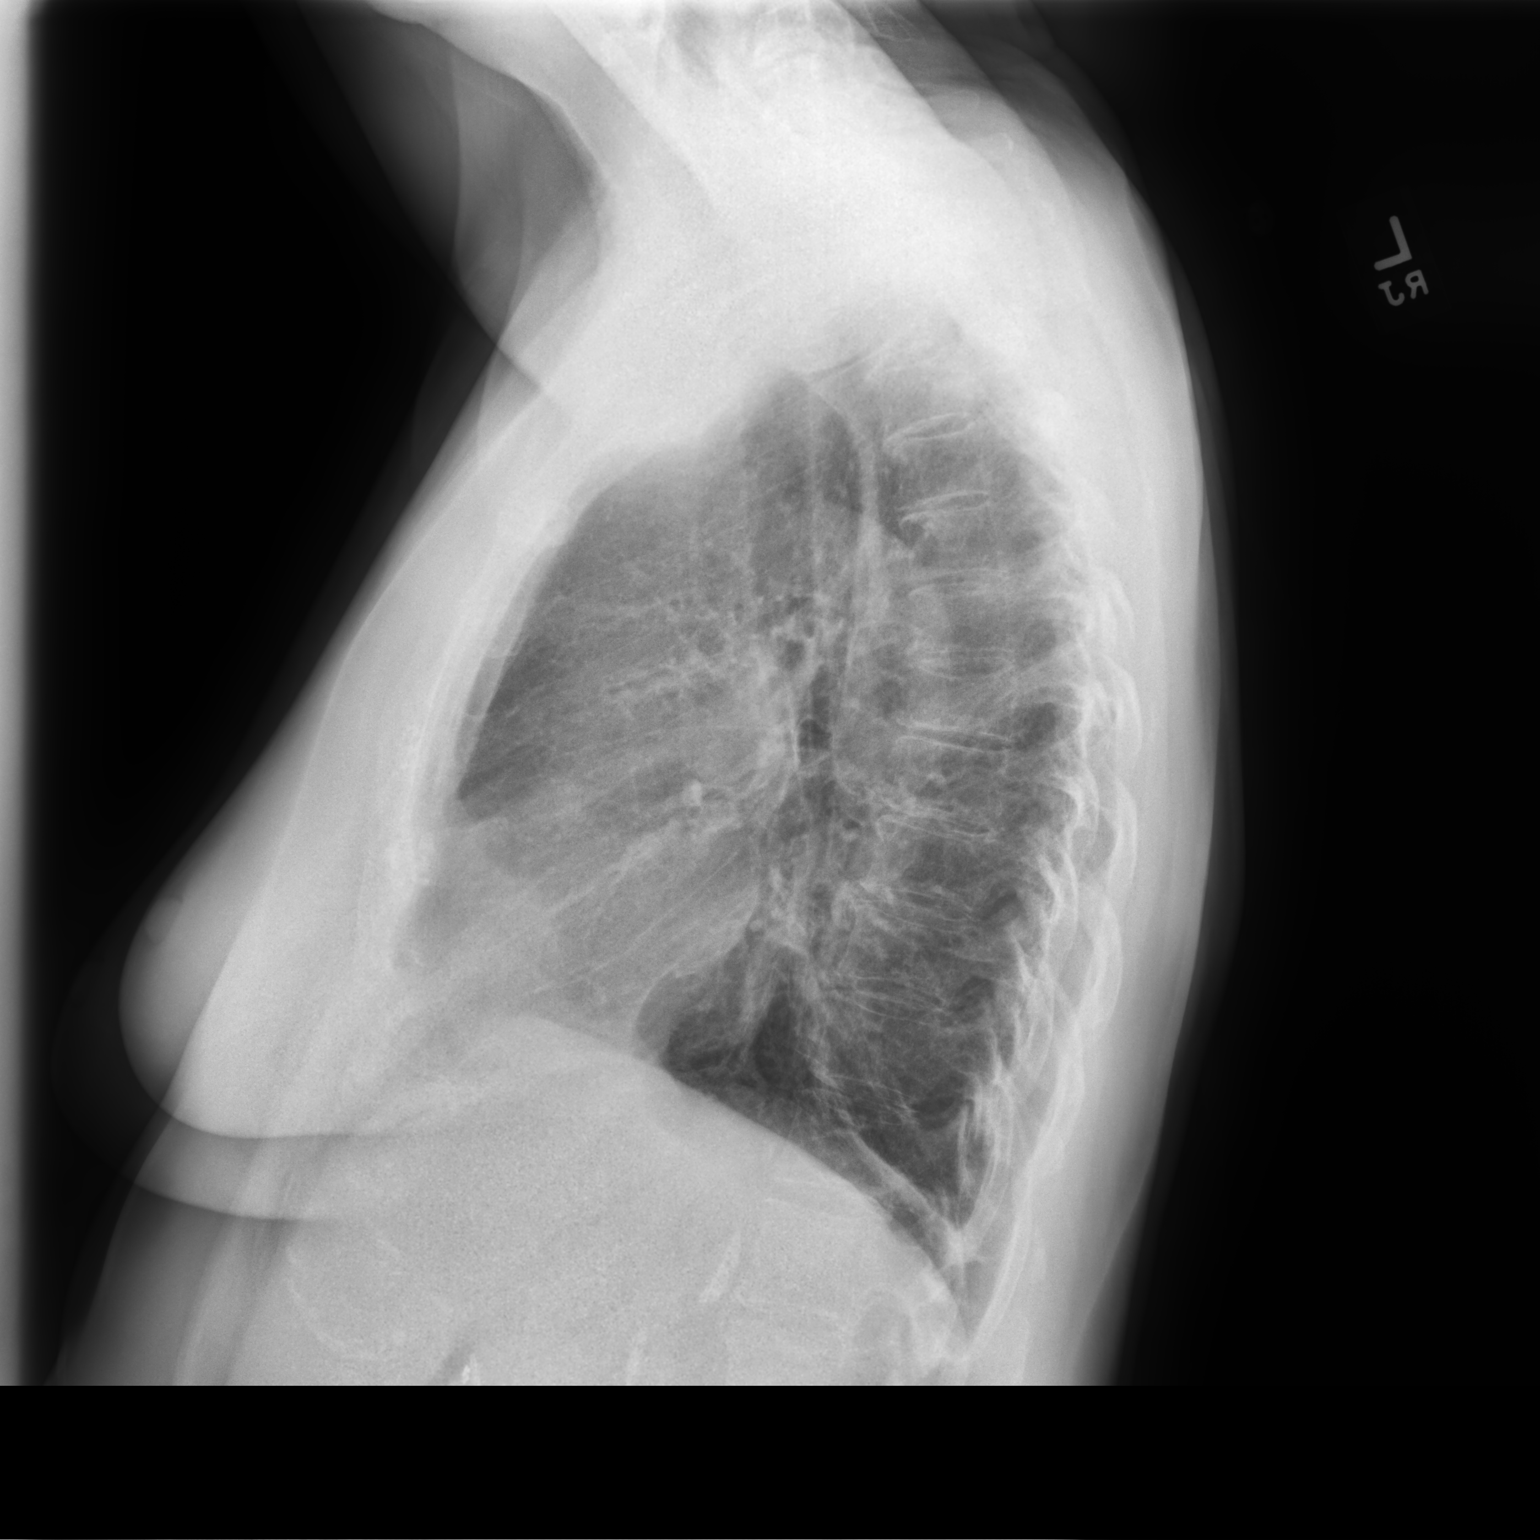

[2 of 2 positions shown; findings below may reference images not displayed]

FINDINGS: The cardiomediastinal silhouette is within normal limits. Aortic
atherosclerosis is noted. There is new mild peribronchial
thickening, and there is a new 1.2 cm nodular density projecting
over the right mid lung on the PA radiograph with slightly
asymmetrically increased interstitial markings in the right mid to
lower lung compared to the left. No pleural effusion or pneumothorax
is identified. Mild S-shaped thoracic scoliosis is noted. There are
surgical clips in the right upper abdomen.
IMPRESSION: 1. Mild peribronchial thickening and slightly increased right mid to
lower lung interstitial markings which could reflect acute infection
or progressive underlying chronic lung disease.
2. New 1.2 cm nodular density projecting over the right mid lung.
This could reflect a focal infiltrate from infection versus a lung
nodule or summation shadow. If the patient is being treated for an
acute illness, consider follow-up radiographs in 3-4 weeks following
treatment to assess for resolution. Otherwise, a chest CT is
recommended.

## 2021-10-22 ENCOUNTER — Other Ambulatory Visit: Payer: Self-pay | Admitting: Internal Medicine

## 2021-10-22 DIAGNOSIS — J479 Bronchiectasis, uncomplicated: Secondary | ICD-10-CM

## 2021-12-22 ENCOUNTER — Ambulatory Visit (INDEPENDENT_AMBULATORY_CARE_PROVIDER_SITE_OTHER): Payer: Medicare Other | Admitting: Internal Medicine

## 2021-12-22 ENCOUNTER — Encounter: Payer: Self-pay | Admitting: Internal Medicine

## 2021-12-22 ENCOUNTER — Other Ambulatory Visit (HOSPITAL_COMMUNITY): Payer: Self-pay

## 2021-12-22 VITALS — BP 132/78 | HR 76 | Temp 98.3°F | Ht 60.0 in | Wt 155.2 lb

## 2021-12-22 DIAGNOSIS — J4530 Mild persistent asthma, uncomplicated: Secondary | ICD-10-CM | POA: Diagnosis not present

## 2021-12-22 DIAGNOSIS — Z23 Encounter for immunization: Secondary | ICD-10-CM

## 2021-12-22 DIAGNOSIS — J479 Bronchiectasis, uncomplicated: Secondary | ICD-10-CM

## 2021-12-22 MED ORDER — AMOXICILLIN-POT CLAVULANATE 875-125 MG PO TABS: 1.0000 | ORAL_TABLET | Freq: Two times a day (BID) | ORAL | 11 refills | Status: DC

## 2021-12-22 MED ORDER — BUDESONIDE-FORMOTEROL FUMARATE 80-4.5 MCG/ACT IN AERO: INHALATION_SPRAY | RESPIRATORY_TRACT | 12 refills | Status: DC

## 2021-12-22 NOTE — Progress Notes (Signed)
Subjective:   Patient ID: Caitlin Newman, female    DOB: March 19, 1945   MRN: [id number]   Brief patient profile:  76 yowf no significant  smoking hx  With lots of wheezing as child outgrew at very young age (doesn't remember exactly when) then recurrent wheezing coughing worse in spring since around 1980 prev seen by [facility] well controlled until Nov 2012 with refractory cough since then so referred 08/06/2011 to pulmonary clinic by PA Caitlin Newman from [place] with brochiectasis on CT 12/2011 and  nl pfts 11/01/2012    History of Present Illness  08/06/2011 1st pulmonary eval/ Caitlin Newman cc persistent cough x 6 months acute onset with "flu" in November now present daily  - had been on advair maint and ACEI but the latter was stopped early May 2013 no improvement cough which is worse p eat and afternoons and cough for an hour at bedtime with sense of something stuck upper mid chest and coughs to point of choking/vomiting>  Productive of min yellow mucus. rec Stop advair, tessilon - only use albuterol (ventolin) puffer as rescue Start Pulmicort twice daily with nebulized albuterol until cough completely gone for a couple of weeks then restart the advair  Schedule sinus ct > Mild chronic sinusitis with mucosal edema in the maxillary sinus  bilaterally. Try prilosec 20mg   Take 30-60 min before first meal of the day and Pepcid 20 mg one bedtime until cough is completely gone for at least a week without the need for cough suppression GERD diet Prednisone 10 mg take  4 each am x 2 days,   2 each am x 2 days,  1 each am x2days and stop       01/24/2014 f/u ov/Caitlin Newman re: bronchiectasis s sign airflow obstr by pfts 11/01/12  Chief Complaint  Patient presents with   Follow-up    Pt states that her cough is much improved. Breathing is doing well. No new co's today. She is using proair about 2 x per day and has used neb x 1 in the past wk.   thinks flutter helping   Mucus light yellow, never bloody Not  limited by breathing from desired activities   rec See calendar for specific medication  Late add consider increase dulera to 200 if not well controlled on f/u     09/03/2014  Acute ov/Caitlin Newman re: bronchiectasis  Chief Complaint  Patient presents with   Acute Visit    Pt c/o increased SOB, cough, chest tightness x 1 wk. She is coughing up very minimal clear sputum. She is using ventolin 3 x per day and neb x 1 per day.    holding out on using tramadol until "coughs so hard she hurts " Has med calendar/ not following action plans   >>augmentin and pred   12/04/2014 NP Follow up : Bronchiectasis  Returns for 3 month follow up. Says overall she is doing okay , had flare last ov , tx w/ augmentin and prednisone taper.  She got better . Has 1 other flare since last ov, resolved with augmentin.  She is feeling good today.  Requests to have diflucan dose to use if she gets yeast infection on abx. Very prone to this .  Advised to hold statin while on this for few days .  We reviewed all her meds and organized them into a med calendar with pt education  Appears to be taking correctly , except confused with reflux meds .  Taking prilosec, nexium at bedtime and stopped  pepcid.  She was recently started on nexium by PCP for reflux on prilosec.  She is suppose to be taking prilosec in am before meal and pepcid at bedtime  Says she gets reflux mainly at night when she lies down.  We discussed GERD diet and correct use of PPI/H2 blocker.  rec Take Omeprazole and Nexium before meal  GERD diet  Follow med calendar closely and bring to each visit.         01/19/2017  f/u ov/Caitlin Newman re:  AB/Bronchiectasis  Chief Complaint  Patient presents with   Follow-up    Pt states doing well. She uses her proair 2 x per wk on average. Last round of augmentin was taken approx 6 wks ago.   Not limited by breathing from desired activities   Using med calendar / action plan well and one round of augmentin only  since last ov  rec For drainage / throat tickle try take CHLORPHENIRAMINE  4 mg - take one every 4 hours as needed - available over the counter- may cause drowsiness so start with just a bedtime dose or two and see how you tolerate it before trying in daytime        02/06/2020  f/u ov/Caitlin Newman re: AB/ bronchiectasis  - worse since moved to [place] and changed to advair Chief Complaint  Patient presents with   Follow-up    pt voiced no complaints  Dyspnea:  Up and step down steps, lifting heavy boxes Cough: min white am  Sleeping: flat bed/ 2 pillows  SABA use: once a day if over does it/ neb q 3 days  02: none  Rec Symbicort 160 or dulera 200 are the best options - all doses are 2 puffs every 12 hours just like advair  Ok to let your PCP refill your medications and follow up here as needed  Or in one year for refills      03/17/2021  f/u ov/Caitlin Newman re: bronchiectasis / now living in [place] on [employer] Complaint  Patient presents with   Follow-up    Breathing is overall doing well. She has not had to use albuterol inhaler or neb in the past year.   Dyspnea:  not limited  Cough: better now  Sleeping: flat bed, two pillows  SABA use: none  02: none  Covid status:   vax x 3 / never infected  Rec For flares of nasty mucus Augmentin 875 mg take one pill twice daily  X 10 days  Diflucan 100 mg up to 3 days for yeast infections  Please schedule a follow up visit in 9 months but call sooner if needed    12/22/2021  f/u ov/[place] office/Caitlin Newman re: bronchiectasis  maint on symbicort 160   Chief Complaint  Patient presents with   Follow-up   Dyspnea:  Not limited by breathing from desired activities  / walks dog a mild a day  Cough: none unless gets choked x years getting worse  Sleeping: flat bed / 2 pillows  SABA use: none  02: none        No obvious day to day or daytime variability or assoc excess/ purulent sputum or mucus plugs or hemoptysis or cp or  chest tightness, subjective wheeze or overt sinus or hb symptoms.   sleeping without nocturnal  or early am exacerbation  of respiratory  c/o's or need for noct saba. Also denies any obvious fluctuation of symptoms with weather or environmental changes or  other aggravating or alleviating factors except as outlined above   No unusual exposure hx or h/o childhood pna/ asthma or knowledge of premature birth.  Current Allergies, Complete Past Medical History, Past Surgical History, Family History, and Social History were reviewed in [facility] electronic medical record.  ROS  The following are not active complaints unless bolded Hoarseness, sore throat, dysphagia/mild globus dental problems, itching, sneezing,  nasal congestion or discharge of excess mucus or purulent secretions, ear ache,   fever, chills, sweats, unintended wt loss or wt gain, classically pleuritic or exertional cp,  orthopnea pnd or arm/hand swelling  or leg swelling, presyncope, palpitations, abdominal pain, anorexia, nausea, vomiting, diarrhea  or change in bowel habits or change in bladder habits, change in stools or change in urine, dysuria, hematuria,  rash, arthralgias, visual complaints, headache, numbness, weakness or ataxia or problems with walking or coordination,  change in mood or  memory.        Current Meds  Medication Sig   albuterol (PROVENTIL) (2.5 MG/3ML) 0.083% nebulizer solution Take 3 mLs (2.5 mg total) by nebulization every 4 (four) hours as needed for wheezing or shortness of breath (((PLAN C))).   albuterol (VENTOLIN HFA) 108 (90 Base) MCG/ACT inhaler Inhale 2 puffs into the lungs every 6 (six) hours as needed for wheezing or shortness of breath.   budesonide-formoterol (SYMBICORT) 80-4.5 MCG/ACT inhaler Take 2 puffs first thing in am and then another 2 puffs about 12 hours later.   chlorpheniramine (CHLOR-TRIMETON) 4 MG tablet Take 4 mg by mouth every 4 (four) hours as needed (drippy nose, throat clearing, post  nasal drip).    Cholecalciferol 125 MCG (5000 UT) capsule 1 tablet Orally Once a day for 90   Coenzyme Q10 (CO Q 10 PO) Take 1 tablet daily by mouth.   famotidine (PEPCID) 20 MG tablet Take 20 mg by mouth at bedtime.   fluconazole (DIFLUCAN) 100 MG tablet Take 1 tablet (100 mg total) by mouth daily.   Omeprazole 20 MG TBEC Take 30- 60 min before your first meal of the day   Respiratory Therapy Supplies (FLUTTER) DEVI Use as directed.   rosuvastatin (CRESTOR) 5 MG tablet Take 5 mg by mouth daily.   sertraline (ZOLOFT) 100 MG tablet Take 1 tablet by mouth daily.   [DISCONTINUED] amoxicillin-clavulanate (AUGMENTIN) 875-125 MG tablet Take 1 tablet by mouth 2 (two) times daily.   [DISCONTINUED] budesonide-formoterol (SYMBICORT) 160-4.5 MCG/ACT inhaler INHALE 2 INHALATIONS BY MOUTH IN THE MORNING AND THEN ANOTHER 2  INHALATIONS BY MOUTH ABOUT 12  HOURS LATER                                    Objective:   Physical Exam  wts  12/22/2021  155 03/17/2021      146   02/06/2020  150  10/25/2019   150  10/16/2019     149 10/20/2017     156   Wt 156 08/06/2011  > 156 12/07/2011 > 12/27/2011  155>155 01/10/12 > 02/18/2012  158 > 157 03/24/2012  > 11/01/2012   149 > 150 11/20/2012 > 154  04/25/2013 >152 05/14/2013 > 06/25/2013  148 > 10/16/2013  147 > 10/23/2013 149>150 11/13/2013 > 12/13/2013  147 > 01/24/2014 150 >153 04/25/2014 >  09/03/2014 >144 12/04/2014 > 02/24/2015 143 > 08/31/2016  151  > 01/19/2017  132     Vital signs reviewed  12/22/2021  - Note   at rest 02 sats  97% on RA   General appearance:    amb wf, minimally rattly spont cough    HEENT : Oropharynx  clear s pnd     Nasal turbinates nl    NECK :  without  apparent JVD/ palpable Nodes/TM    LUNGS: no acc muscle use,  Nl contour chest with mild insp/exp rhonchi  bilaterally without cough on insp or exp maneuvers   CV:  RRR  no s3 or murmur or increase in P2, and no edema   ABD:  soft and nontender with nl inspiratory excursion in the  supine position. No bruits or organomegaly appreciated   MS:  Nl gait/ ext warm without deformities Or obvious joint restrictions  calf tenderness, cyanosis or clubbing    SKIN: warm and dry without lesions    NEURO:  alert, approp, nl sensorium with  no motor or cerebellar deficits apparent.         Assessment & Plan:    

## 2021-12-22 NOTE — Progress Notes (Signed)
Subjective:   Patient ID: Caitlin Newman, female    DOB: [birth year]   MRN: [id number]   Brief patient profile:  76 yowf no significant  smoking hx  With lots of wheezing as child outgrew at very young age (doesn't remember exactly when) then recurrent wheezing coughing worse in spring since around 1980 prev seen by [facility] well controlled until Nov 2012 with refractory cough since then so referred 08/06/2011 to pulmonary clinic by PA Ninfa Linden from [facility] with brochiectasis on CT 12/2011 and  nl pfts 11/01/2012    History of Present Illness  08/06/2011 1st pulmonary eval/ Caitlin Newman cc persistent cough x 6 months acute onset with "flu" in November now present daily  - had been on advair maint and ACEI but the latter was stopped early May 2013 no improvement cough which is worse p eat and afternoons and cough for an hour at bedtime with sense of something stuck upper mid chest and coughs to point of choking/vomiting>  Productive of min yellow mucus. rec Stop advair, tessilon - only use albuterol (ventolin) puffer as rescue Start Pulmicort twice daily with nebulized albuterol until cough completely gone for a couple of weeks then restart the advair  Schedule sinus ct > Mild chronic sinusitis with mucosal edema in the maxillary sinus  bilaterally. Try prilosec 20mg   Take 30-60 min before first meal of the day and Pepcid 20 mg one bedtime until cough is completely gone for at least a week without the need for cough suppression GERD diet Prednisone 10 mg take  4 each am x 2 days,   2 each am x 2 days,  1 each am x2days and stop       01/24/2014 f/u ov/Caitlin Newman re: bronchiectasis s sign airflow obstr by pfts 11/01/12  Chief Complaint  Patient presents with   Follow-up    Pt states that her cough is much improved. Breathing is doing well. No new co's today. She is using proair about 2 x per day and has used neb x 1 in the past wk.   thinks flutter helping   Mucus light yellow, never bloody Not limited  by breathing from desired activities   rec See calendar for specific medication  Late add consider increase dulera to 200 if not well controlled on f/u     09/03/2014  Acute ov/Caitlin Newman re: bronchiectasis  Chief Complaint  Patient presents with   Acute Visit    Pt c/o increased SOB, cough, chest tightness x 1 wk. She is coughing up very minimal clear sputum. She is using ventolin 3 x per day and neb x 1 per day.    holding out on using tramadol until "coughs so hard she hurts " Has med calendar/ not following action plans   >>augmentin and pred   12/04/2014 NP Follow up : Bronchiectasis  Returns for 3 month follow up. Says overall she is doing okay , had flare last ov , tx w/ augmentin and prednisone taper.  She got better . Has 1 other flare since last ov, resolved with augmentin.  She is feeling good today.  Requests to have diflucan dose to use if she gets yeast infection on abx. Very prone to this .  Advised to hold statin while on this for few days .  We reviewed all her meds and organized them into a med calendar with pt education  Appears to be taking correctly , except confused with reflux meds .  Taking prilosec, nexium at bedtime and stopped  pepcid.  She was recently started on nexium by PCP for reflux on prilosec.  She is suppose to be taking prilosec in am before meal and pepcid at bedtime  Says she gets reflux mainly at night when she lies down.  We discussed GERD diet and correct use of PPI/H2 blocker.  rec Take Omeprazole and Nexium before meal  GERD diet  Follow med calendar closely and bring to each visit.         01/19/2017  f/u ov/Caitlin Newman re:  AB/Bronchiectasis  Chief Complaint  Patient presents with   Follow-up    Pt states doing well. She uses her proair 2 x per wk on average. Last round of augmentin was taken approx 6 wks ago.   Not limited by breathing from desired activities   Using med calendar / action plan well and one round of augmentin only since last  ov  rec For drainage / throat tickle try take CHLORPHENIRAMINE  4 mg - take one every 4 hours as needed - available over the counter- may cause drowsiness so start with just a bedtime dose or two and see how you tolerate it before trying in daytime        02/06/2020  f/u ov/Caitlin Newman re: AB/ bronchiectasis  - worse since moved to [place] and changed to advair Chief Complaint  Patient presents with   Follow-up    pt voiced no complaints  Dyspnea:  Up and step down steps, lifting heavy boxes Cough: min white am  Sleeping: flat bed/ 2 pillows  SABA use: once a day if over does it/ neb q 3 days  02: none  Rec Symbicort 160 or dulera 200 are the best options - all doses are 2 puffs every 12 hours just like advair  Ok to let your PCP refill your medications and follow up here as needed  Or in one year for refills      03/17/2021  f/u ov/Caitlin Newman re: bronchiectasis / [place] [facility] [place] [employer] Complaint  Patient presents with   Follow-up    Breathing is overall doing well. She has not had to use albuterol inhaler or neb in the past year.   Dyspnea:  not limited  Cough: better now  Sleeping: flat bed, two pillows  SABA use: none  02: none  Covid status:   vax x 3 / never infected  Rec For flares of nasty mucus Augmentin 875 mg take one pill twice daily  X 10 days  Diflucan 100 mg up to 3 days for yeast infections  Please schedule a follow up visit in 9 months but call sooner if needed    12/22/2021  f/u ov/[place] office/Caitlin Newman re: bronchiectasis  maint on symbicort 160   Chief Complaint  Patient presents with   Follow-up   Dyspnea:  Not limited by breathing from desired activities  / walks dog a mild a day  Cough: none unless gets choked x years getting worse  Sleeping: flat bed / 2 pillows  SABA use: none  02: none        No obvious day to day or daytime variability or assoc excess/ purulent sputum or mucus plugs or hemoptysis or cp or chest  tightness, subjective wheeze or overt sinus or hb symptoms.   sleeping without nocturnal  or early am exacerbation  of respiratory  c/o's or need for noct saba. Also denies any obvious fluctuation of symptoms with weather or environmental changes or  other aggravating or alleviating factors except as outlined above   No unusual exposure hx or h/o childhood pna/ asthma or knowledge of premature birth.  Current Allergies, Complete Past Medical History, Past Surgical History, Family History, and Social History were reviewed in [employer] record.  ROS  The following are not active complaints unless bolded Hoarseness, sore throat, dysphagia/mild globus dental problems, itching, sneezing,  nasal congestion or discharge of excess mucus or purulent secretions, ear ache,   fever, chills, sweats, unintended wt loss or wt gain, classically pleuritic or exertional cp,  orthopnea pnd or arm/hand swelling  or leg swelling, presyncope, palpitations, abdominal pain, anorexia, nausea, vomiting, diarrhea  or change in bowel habits or change in bladder habits, change in stools or change in urine, dysuria, hematuria,  rash, arthralgias, visual complaints, headache, numbness, weakness or ataxia or problems with walking or coordination,  change in mood or  memory.        Current Meds  Medication Sig   albuterol (PROVENTIL) (2.5 MG/3ML) 0.083% nebulizer solution Take 3 mLs (2.5 mg total) by nebulization every 4 (four) hours as needed for wheezing or shortness of breath (((PLAN C))).   albuterol (VENTOLIN HFA) 108 (90 Base) MCG/ACT inhaler Inhale 2 puffs into the lungs every 6 (six) hours as needed for wheezing or shortness of breath.   budesonide-formoterol (SYMBICORT) 80-4.5 MCG/ACT inhaler Take 2 puffs first thing in am and then another 2 puffs about 12 hours later.   chlorpheniramine (CHLOR-TRIMETON) 4 MG tablet Take 4 mg by mouth every 4 (four) hours as needed (drippy nose, throat clearing, post  nasal drip).    Cholecalciferol 125 MCG (5000 UT) capsule 1 tablet Orally Once a day for 90   Coenzyme Q10 (CO Q 10 PO) Take 1 tablet daily by mouth.   famotidine (PEPCID) 20 MG tablet Take 20 mg by mouth at bedtime.   fluconazole (DIFLUCAN) 100 MG tablet Take 1 tablet (100 mg total) by mouth daily.   Omeprazole 20 MG TBEC Take 30- 60 min before your first meal of the day   Respiratory Therapy Supplies (FLUTTER) DEVI Use as directed.   rosuvastatin (CRESTOR) 5 MG tablet Take 5 mg by mouth daily.   sertraline (ZOLOFT) 100 MG tablet Take 1 tablet by mouth daily.   [DISCONTINUED] amoxicillin-clavulanate (AUGMENTIN) 875-125 MG tablet Take 1 tablet by mouth 2 (two) times daily.   [DISCONTINUED] budesonide-formoterol (SYMBICORT) 160-4.5 MCG/ACT inhaler INHALE 2 INHALATIONS BY MOUTH IN THE MORNING AND THEN ANOTHER 2  INHALATIONS BY MOUTH ABOUT 12  HOURS LATER                                    Objective:   Physical Exam  wts  12/22/2021  155 03/17/2021      146   02/06/2020  150  10/25/2019   150  10/16/2019     149 10/20/2017     156   Wt 156 08/06/2011  > 156 12/07/2011 > 12/27/2011  155>155 01/10/12 > 02/18/2012  158 > 157 03/24/2012  > 11/01/2012   149 > 150 11/20/2012 > 154  04/25/2013 >152 05/14/2013 > 06/25/2013  148 > 10/16/2013  147 > 10/23/2013 149>150 11/13/2013 > 12/13/2013  147 > 01/24/2014 150 >153 04/25/2014 >  09/03/2014 >144 12/04/2014 > 02/24/2015 143 > 08/31/2016  151  > 01/19/2017  132     Vital signs reviewed  12/22/2021  - Note  at rest 02 sats  97% on RA   General appearance:    amb wf, minimally rattly spont cough    HEENT : Oropharynx  clear s pnd     Nasal turbinates nl    NECK :  without  apparent JVD/ palpable Nodes/TM    LUNGS: no acc muscle use,  Nl contour chest with mild insp/exp rhonchi  bilaterally without cough on insp or exp maneuvers   CV:  RRR  no s3 or murmur or increase in P2, and no edema   ABD:  soft and nontender with nl inspiratory excursion in the  supine position. No bruits or organomegaly appreciated   MS:  Nl gait/ ext warm without deformities Or obvious joint restrictions  calf tenderness, cyanosis or clubbing    SKIN: warm and dry without lesions    NEURO:  alert, approp, nl sensorium with  no motor or cerebellar deficits apparent.         Assessment & Plan:

## 2021-12-22 NOTE — Assessment & Plan Note (Signed)
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 identified - hfa 75% p coaching 11/01/2012 and can't afford pulmocort neb > rechallenged with dulera 100 2bid  -Med calendar 11/20/2012    - FENO 12/02/2015  =   15 on dulera 100 2bid   - 08/31/2018  After extensive coaching inhaler device,  effectiveness =    90% so add symb 160 2bid for flares if ab to avoid steroids, o/w maint on advair 115 2bid for insurance >Wants to change back to symbicort 160 with new insurance in 2022  All goals of chronic asthma control met including optimal function and elimination of symptoms with minimal need for rescue therapy.  Contingencies discussed in full including contacting this office immediately if not controlling the symptoms using the rule of two's.     >>> try symb 80 2bid / rule of two's reviewed to assure adequate control on the lower strength symbicort, otherwise to back to the 160           Each maintenance medication was reviewed in detail including emphasizing most importantly the difference between maintenance and prns and under what circumstances the prns are to be triggered using an action plan format where appropriate.  Total time for H and P, chart review, counseling, reviewing hfa device(s) and generating customized AVS unique to this office visit / same day charting = 25 min

## 2021-12-22 NOTE — Assessment & Plan Note (Signed)
CT chest 01/10/12 1. Bilateral bronchiectasis is most evident in the left lower lobe where ground-glass attenuation and scarring is evident. No significant airspace consolidation is present. 2. Upper lobe bronchiectasis is slightly worse on the right. No significant parenchymal disease is associated. 3. Single calcified right paratracheal node may represent treated disease or a history granulomatous disease. Alpha one AT >  MM  02/2012  PFT's wnl 11/01/2012  Prevar rx 06/25/13 and pneumovax 01/24/2014  CTa 11/21/13 > no acute changes  - PFT's  03/02/2016  FEV1 2.03 (104 % ) ratio 86  p 4 % improvement from saba p dulera 100 x2   prior to study with DLCO  88/89c % corrects to 98 % for alv volume    - 03/17/2021 requested  augmentin to have on hand for flares/ diflucan 100 mg x up to 3 days prn yeast infetions   rec change to symb 80 2bid to reduce likelihood of infection and throat irritatoin

## 2021-12-22 NOTE — Patient Instructions (Signed)
Try symbicort 80 Take 2 puffs first thing in am and then another 2 puffs about 12 hours later.   Work on inhaler technique:  relax and gently blow all the way out then take a nice smooth full deep breath back in, triggering the inhaler at same time you start breathing in.  Hold breath in for at least  5 seconds if you can. Blow out symbicort  thru nose. Rinse and gargle with water when done.  If mouth or throat bother you at all,  try brushing teeth/gums/tongue with arm and hammer toothpaste/ make a slurry and gargle and spit out.        If doing well on 80 strength then the next refill for 3 month supply we can change 80 but ok to use them up   Please schedule a follow up visit in 12 months but call sooner if needed

## 2021-12-22 NOTE — Progress Notes (Signed)
Subjective:   Patient ID: Caitlin Newman, female    DOB: March 19, 1945   MRN: [id number]   Brief patient profile:  76 yowf no significant  smoking hx  With lots of wheezing as child outgrew at very young age (doesn't remember exactly when) then recurrent wheezing coughing worse in spring since around 1980 prev seen by [facility] well controlled until Nov 2012 with refractory cough since then so referred 08/06/2011 to pulmonary clinic by PA Ninfa Linden from [place] with brochiectasis on CT 12/2011 and  nl pfts 11/01/2012    History of Present Illness  08/06/2011 1st pulmonary eval/ Caitlin Newman cc persistent cough x 6 months acute onset with "flu" in November now present daily  - had been on advair maint and ACEI but the latter was stopped early May 2013 no improvement cough which is worse p eat and afternoons and cough for an hour at bedtime with sense of something stuck upper mid chest and coughs to point of choking/vomiting>  Productive of min yellow mucus. rec Stop advair, tessilon - only use albuterol (ventolin) puffer as rescue Start Pulmicort twice daily with nebulized albuterol until cough completely gone for a couple of weeks then restart the advair  Schedule sinus ct > Mild chronic sinusitis with mucosal edema in the maxillary sinus  bilaterally. Try prilosec 20mg   Take 30-60 min before first meal of the day and Pepcid 20 mg one bedtime until cough is completely gone for at least a week without the need for cough suppression GERD diet Prednisone 10 mg take  4 each am x 2 days,   2 each am x 2 days,  1 each am x2days and stop       01/24/2014 f/u ov/Caitlin Newman re: bronchiectasis s sign airflow obstr by pfts 11/01/12  Chief Complaint  Patient presents with   Follow-up    Pt states that her cough is much improved. Breathing is doing well. No new co's today. She is using proair about 2 x per day and has used neb x 1 in the past wk.   thinks flutter helping   Mucus light yellow, never bloody Not  limited by breathing from desired activities   rec See calendar for specific medication  Late add consider increase dulera to 200 if not well controlled on f/u     09/03/2014  Acute ov/Caitlin Newman re: bronchiectasis  Chief Complaint  Patient presents with   Acute Visit    Pt c/o increased SOB, cough, chest tightness x 1 wk. She is coughing up very minimal clear sputum. She is using ventolin 3 x per day and neb x 1 per day.    holding out on using tramadol until "coughs so hard she hurts " Has med calendar/ not following action plans   >>augmentin and pred   12/04/2014 NP Follow up : Bronchiectasis  Returns for 3 month follow up. Says overall she is doing okay , had flare last ov , tx w/ augmentin and prednisone taper.  She got better . Has 1 other flare since last ov, resolved with augmentin.  She is feeling good today.  Requests to have diflucan dose to use if she gets yeast infection on abx. Very prone to this .  Advised to hold statin while on this for few days .  We reviewed all her meds and organized them into a med calendar with pt education  Appears to be taking correctly , except confused with reflux meds .  Taking prilosec, nexium at bedtime and stopped  pepcid.  She was recently started on nexium by PCP for reflux on prilosec.  She is suppose to be taking prilosec in am before meal and pepcid at bedtime  Says she gets reflux mainly at night when she lies down.  We discussed GERD diet and correct use of PPI/H2 blocker.  rec Take Omeprazole and Nexium before meal  GERD diet  Follow med calendar closely and bring to each visit.         01/19/2017  f/u ov/Caitlin Newman re:  AB/Bronchiectasis  Chief Complaint  Patient presents with   Follow-up    Pt states doing well. She uses her proair 2 x per wk on average. Last round of augmentin was taken approx 6 wks ago.   Not limited by breathing from desired activities   Using med calendar / action plan well and one round of augmentin only  since last ov  rec For drainage / throat tickle try take CHLORPHENIRAMINE  4 mg - take one every 4 hours as needed - available over the counter- may cause drowsiness so start with just a bedtime dose or two and see how you tolerate it before trying in daytime        02/06/2020  f/u ov/Caitlin Newman re: AB/ bronchiectasis  - worse since moved to [place] and changed to advair Chief Complaint  Patient presents with   Follow-up    pt voiced no complaints  Dyspnea:  Up and step down steps, lifting heavy boxes Cough: min white am  Sleeping: flat bed/ 2 pillows  SABA use: once a day if over does it/ neb q 3 days  02: none  Rec Symbicort 160 or dulera 200 are the best options - all doses are 2 puffs every 12 hours just like advair  Ok to let your PCP refill your medications and follow up here as needed  Or in one year for refills      03/17/2021  f/u ov/Caitlin Newman re: bronchiectasis / now living in [place] on [employer] Complaint  Patient presents with   Follow-up    Breathing is overall doing well. She has not had to use albuterol inhaler or neb in the past year.   Dyspnea:  not limited  Cough: better now  Sleeping: flat bed, two pillows  SABA use: none  02: none  Covid status:   vax x 3 / never infected  Rec For flares of nasty mucus Augmentin 875 mg take one pill twice daily  X 10 days  Diflucan 100 mg up to 3 days for yeast infections  Please schedule a follow up visit in 9 months but call sooner if needed    12/22/2021  f/u ov/Caitlin Newman/Caitlin Newman re: bronchiectasis  maint on symbicort 160   Chief Complaint  Patient presents with   Follow-up   Dyspnea:  Not limited by breathing from desired activities  / walks dog a mild a day  Cough: none unless gets choked x years getting worse  Sleeping: flat bed / 2 pillows  SABA use: none  02: none        No obvious day to day or daytime variability or assoc excess/ purulent sputum or mucus plugs or hemoptysis or cp or  chest tightness, subjective wheeze or overt sinus or hb symptoms.   sleeping without nocturnal  or early am exacerbation  of respiratory  c/o's or need for noct saba. Also denies any obvious fluctuation of symptoms with weather or environmental changes or  other aggravating or alleviating factors except as outlined above   No unusual exposure hx or h/o childhood pna/ asthma or knowledge of premature birth.  Current Allergies, Complete Past Medical History, Past Surgical History, Family History, and Social History were reviewed in [employer] record.  ROS  The following are not active complaints unless bolded Hoarseness, sore throat, dysphagia/mild globus dental problems, itching, sneezing,  nasal congestion or discharge of excess mucus or purulent secretions, ear ache,   fever, chills, sweats, unintended wt loss or wt gain, classically pleuritic or exertional cp,  orthopnea pnd or arm/hand swelling  or leg swelling, presyncope, palpitations, abdominal pain, anorexia, nausea, vomiting, diarrhea  or change in bowel habits or change in bladder habits, change in stools or change in urine, dysuria, hematuria,  rash, arthralgias, visual complaints, headache, numbness, weakness or ataxia or problems with walking or coordination,  change in mood or  memory.        Current Meds  Medication Sig   albuterol (PROVENTIL) (2.5 MG/3ML) 0.083% nebulizer solution Take 3 mLs (2.5 mg total) by nebulization every 4 (four) hours as needed for wheezing or shortness of breath (((PLAN C))).   albuterol (VENTOLIN HFA) 108 (90 Base) MCG/ACT inhaler Inhale 2 puffs into the lungs every 6 (six) hours as needed for wheezing or shortness of breath.   amoxicillin-clavulanate (AUGMENTIN) 875-125 MG tablet Take 1 tablet by mouth 2 (two) times daily.   budesonide-formoterol (SYMBICORT) 160-4.5 MCG/ACT inhaler INHALE 2 INHALATIONS BY MOUTH IN THE MORNING AND THEN ANOTHER 2  INHALATIONS BY MOUTH ABOUT 12  HOURS  LATER   chlorpheniramine (CHLOR-TRIMETON) 4 MG tablet Take 4 mg by mouth every 4 (four) hours as needed (drippy nose, throat clearing, post nasal drip).    Cholecalciferol 125 MCG (5000 UT) capsule 1 tablet Orally Once a day for 90   Coenzyme Q10 (CO Q 10 PO) Take 1 tablet daily by mouth.   famotidine (PEPCID) 20 MG tablet Take 20 mg by mouth at bedtime.   fluconazole (DIFLUCAN) 100 MG tablet Take 1 tablet (100 mg total) by mouth daily.   Omeprazole 20 MG TBEC Take 30- 60 min before your first meal of the day   Respiratory Therapy Supplies (FLUTTER) DEVI Use as directed.   rosuvastatin (CRESTOR) 5 MG tablet Take 5 mg by mouth daily.   sertraline (ZOLOFT) 100 MG tablet Take 1 tablet by mouth daily.                                    Objective:   Physical Exam  wts  12/22/2021  155 03/17/2021      146   02/06/2020  150  10/25/2019   150  10/16/2019     149 10/20/2017     156   Wt 156 08/06/2011  > 156 12/07/2011 > 12/27/2011  155>155 01/10/12 > 02/18/2012  158 > 157 03/24/2012  > 11/01/2012   149 > 150 11/20/2012 > 154  04/25/2013 >152 05/14/2013 > 06/25/2013  148 > 10/16/2013  147 > 10/23/2013 149>150 11/13/2013 > 12/13/2013  147 > 01/24/2014 150 >153 04/25/2014 >  09/03/2014 >144 12/04/2014 > 02/24/2015 143 > 08/31/2016  151  > 01/19/2017  132     Vital signs reviewed  12/22/2021  - Note at rest 02 sats  97% on RA   General appearance:    amb wf, minimally rattly spont cough    HEENT :  Oropharynx  clear s pnd     Nasal turbinates nl    NECK :  without  apparent JVD/ palpable Nodes/TM    LUNGS: no acc muscle use,  Nl contour chest with mild insp/exp rhonchi  bilaterally without cough on insp or exp maneuvers   CV:  RRR  no s3 or murmur or increase in P2, and no edema   ABD:  soft and nontender with nl inspiratory excursion in the supine position. No bruits or organomegaly appreciated   MS:  Nl gait/ ext warm without deformities Or obvious joint restrictions  calf tenderness, cyanosis or  clubbing    SKIN: warm and dry without lesions    NEURO:  alert, approp, nl sensorium with  no motor or cerebellar deficits apparent.         Assessment & Plan:

## 2022-08-02 NOTE — Progress Notes (Unsigned)
Subjective:   Patient ID: Caitlin Newman, female    DOB: 01-12-1946   MRN: [id number]   Brief patient profile:  76 yowf no significant  smoking hx  With lots of wheezing as child outgrew at very young age (doesn't remember exactly when) then recurrent wheezing coughing worse in spring since around 1980 prev seen by [facility] well controlled until Nov 2012 with refractory cough since then so referred 08/06/2011 to pulmonary clinic by PA Ninfa Linden from [place] with brochiectasis on CT 12/2011 and  nl pfts 11/01/2012    History of Present Illness  08/06/2011 1st pulmonary eval/ [name] cc persistent cough x 6 months acute onset with "flu" in November now present daily  - had been on advair maint and ACEI but the latter was stopped early May 2013 no improvement cough which is worse p eat and afternoons and cough for an hour at bedtime with sense of something stuck upper mid chest and coughs to point of choking/vomiting>  Productive of min yellow mucus. rec Stop advair, tessilon - only use albuterol (ventolin) puffer as rescue Start Pulmicort twice daily with nebulized albuterol until cough completely gone for a couple of weeks then restart the advair  Schedule sinus ct > Mild chronic sinusitis with mucosal edema in the maxillary sinus  bilaterally. Try prilosec 20mg   Take 30-60 min before first meal of the day and Pepcid 20 mg one bedtime until cough is completely gone for at least a week without the need for cough suppression GERD diet Prednisone 10 mg take  4 each am x 2 days,   2 each am x 2 days,  1 each am x2days and stop       01/24/2014 f/u ov/Brennan Litzinger re: bronchiectasis s sign airflow obstr by pfts 11/01/12  Chief Complaint  Patient presents with   Follow-up    Pt states that her cough is much improved. Breathing is doing well. No new co's today. She is using proair about 2 x per day and has used neb x 1 in the past wk.   thinks flutter helping   Mucus light yellow, never bloody Not limited  by breathing from desired activities   rec See calendar for specific medication  Late add consider increase dulera to 200 if not well controlled on f/u     09/03/2014  Acute ov/[name] re: bronchiectasis  Chief Complaint  Patient presents with   Acute Visit    Pt c/o increased SOB, cough, chest tightness x 1 wk. She is coughing up very minimal clear sputum. She is using ventolin 3 x per day and neb x 1 per day.    holding out on using tramadol until "coughs so hard she hurts " Has med calendar/ not following action plans   >>augmentin and pred   12/04/2014 NP Follow up : Bronchiectasis  Returns for 3 month follow up. Says overall she is doing okay , had flare last ov , tx w/ augmentin and prednisone taper.  She got better . Has 1 other flare since last ov, resolved with augmentin.  She is feeling good today.  Requests to have diflucan dose to use if she gets yeast infection on abx. Very prone to this .  Advised to hold statin while on this for few days .  We reviewed all her meds and organized them into a med calendar with pt education  Appears to be taking correctly , except confused with reflux meds .  Taking prilosec, nexium at bedtime and stopped  pepcid.  She was recently started on nexium by PCP for reflux on prilosec.  She is suppose to be taking prilosec in am before meal and pepcid at bedtime  Says she gets reflux mainly at night when she lies down.  We discussed GERD diet and correct use of PPI/H2 blocker.  rec Take Omeprazole and Nexium before meal  GERD diet  Follow med calendar closely and bring to each visit.         01/19/2017  f/u ov/[name] re:  AB/Bronchiectasis  Chief Complaint  Patient presents with   Follow-up    Pt states doing well. She uses her proair 2 x per wk on average. Last round of augmentin was taken approx 6 wks ago.   Not limited by breathing from desired activities   Using med calendar / action plan well and one round of augmentin only since last  ov  rec For drainage / throat tickle try take CHLORPHENIRAMINE  4 mg - take one every 4 hours as needed - available over the counter- may cause drowsiness so start with just a bedtime dose or two and see how you tolerate it before trying in daytime        02/06/2020  f/u ov/[name] re: AB/ bronchiectasis  - worse since moved to [place] and changed to advair Chief Complaint  Patient presents with   Follow-up    pt voiced no complaints  Dyspnea:  Up and step down steps, lifting heavy boxes Cough: min white am  Sleeping: flat bed/ 2 pillows  SABA use: once a day if over does it/ neb q 3 days  02: none  Rec Symbicort 160 or dulera 200 are the best options - all doses are 2 puffs every 12 hours just like advair  Ok to let your PCP refill your medications and follow up here as needed  Or in one year for refills      03/17/2021  f/u ov/Square [name] re: bronchiectasis / now living in [place] on [employer] Complaint  Patient presents with   Follow-up    Breathing is overall doing well. She has not had to use albuterol inhaler or neb in the past year.   Dyspnea:  not limited  Cough: better now  Sleeping: flat bed, two pillows  SABA use: none  02: none  Covid status:   vax x 3 / never infected  Rec For flares of nasty mucus Augmentin 875 mg take one pill twice daily  X 10 days  Diflucan 100 mg up to 3 days for yeast infections  Please schedule a follow up visit in 9 months but call sooner if needed    12/22/2021  f/u ov/[place] office/[name] re: bronchiectasis  maint on symbicort 160   Chief Complaint  Patient presents with   Follow-up   Dyspnea:  Not limited by breathing from desired activities  / walks dog a mild a day  Cough: none unless gets choked x years getting worse  Sleeping: flat bed / 2 pillows  SABA use: none  02: none  Rec Try symbicort 80 Take 2 puffs first thing in am and then another 2 puffs about 12 hours later.  Work on inhaler technique:   If  doing well on 80 strength then the next refill for 3 month supply we can change 80 but ok to use them up   Please schedule a follow up visit in 12 months but call sooner if needed  08/03/2022  f/u ov/ office/Chipper Koudelka re: *** maint on ***  No chief complaint on file.   Dyspnea:  *** Cough: *** Sleeping: *** SABA use: *** 02: *** Covid status: *** Lung cancer screening: ***   No obvious day to day or daytime variability or assoc excess/ purulent sputum or mucus plugs or hemoptysis or cp or chest tightness, subjective wheeze or overt sinus or hb symptoms.   *** without nocturnal  or early am exacerbation  of respiratory  c/o's or need for noct saba. Also denies any obvious fluctuation of symptoms with weather or environmental changes or other aggravating or alleviating factors except as outlined above   No unusual exposure hx or h/o childhood pna/ asthma or knowledge of premature birth.  Current Allergies, Complete Past Medical History, Past Surgical History, Family History, and Social History were reviewed in [employer] record.  ROS  The following are not active complaints unless bolded Hoarseness, sore throat, dysphagia, dental problems, itching, sneezing,  nasal congestion or discharge of excess mucus or purulent secretions, ear ache,   fever, chills, sweats, unintended wt loss or wt gain, classically pleuritic or exertional cp,  orthopnea pnd or arm/hand swelling  or leg swelling, presyncope, palpitations, abdominal pain, anorexia, nausea, vomiting, diarrhea  or change in bowel habits or change in bladder habits, change in stools or change in urine, dysuria, hematuria,  rash, arthralgias, visual complaints, headache, numbness, weakness or ataxia or problems with walking or coordination,  change in mood or  memory.        No outpatient medications have been marked as taking for the 08/03/22 encounter (Appointment) with Nyoka Cowden, MD.                             Objective:   Physical Exam  wts  08/03/2022     *** 12/22/2021  155 03/17/2021      146   02/06/2020  150  10/25/2019   150  10/16/2019     149 10/20/2017     156   Wt 156 08/06/2011  > 156 12/07/2011 > 12/27/2011  155>155 01/10/12 > 02/18/2012  158 > 157 03/24/2012  > 11/01/2012   149 > 150 11/20/2012 > 154  04/25/2013 >152 05/14/2013 > 06/25/2013  148 > 10/16/2013  147 > 10/23/2013 149>150 11/13/2013 > 12/13/2013  147 > 01/24/2014 150 >153 04/25/2014 >  09/03/2014 >144 12/04/2014 > 02/24/2015 143 > 08/31/2016  151  > 01/19/2017  132    Vital signs reviewed  08/03/2022  - Note at rest 02 sats  ***% on ***   General appearance:    ***  mild insp/exp rhonchi  bilaterally ***         Assessment & Plan:

## 2022-08-03 ENCOUNTER — Ambulatory Visit (INDEPENDENT_AMBULATORY_CARE_PROVIDER_SITE_OTHER): Payer: Medicare Other | Admitting: Internal Medicine

## 2022-08-03 ENCOUNTER — Encounter: Payer: Self-pay | Admitting: Internal Medicine

## 2022-08-03 VITALS — BP 140/68 | HR 72 | Wt 150.0 lb

## 2022-08-03 DIAGNOSIS — J4530 Mild persistent asthma, uncomplicated: Secondary | ICD-10-CM | POA: Diagnosis not present

## 2022-08-03 DIAGNOSIS — J479 Bronchiectasis, uncomplicated: Secondary | ICD-10-CM | POA: Diagnosis not present

## 2022-08-03 MED ORDER — BUDESONIDE-FORMOTEROL FUMARATE 80-4.5 MCG/ACT IN AERO: INHALATION_SPRAY | RESPIRATORY_TRACT | 3 refills | Status: DC

## 2022-08-03 MED ORDER — AMOXICILLIN-POT CLAVULANATE 875-125 MG PO TABS: 1.0000 | ORAL_TABLET | Freq: Two times a day (BID) | ORAL | 11 refills | Status: DC

## 2022-08-03 MED ORDER — FLUCONAZOLE 100 MG PO TABS: 100.0000 mg | ORAL_TABLET | Freq: Every day | ORAL | 11 refills | Status: AC

## 2022-08-03 NOTE — Assessment & Plan Note (Addendum)
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 identified - hfa 75% p coaching 11/01/2012 and can't afford pulmocort neb > rechallenged with dulera 100 2bid  -Med calendar 11/20/2012    - FENO 12/02/2015  =   15 on dulera 100 2bid   - 08/31/2018  After extensive coaching inhaler device,  effectiveness =    90% so add symb 160 2bid for flares if ab to avoid steroids, o/w maint on advair 115 2bid for insurance   All goals of chronic asthma control met including optimal function and elimination of symptoms with minimal need for rescue therapy.  Contingencies discussed in full including contacting this office immediately if not controlling the symptoms using the rule of two's.     F/u yearly, sooner prn          Each maintenance medication was reviewed in detail including emphasizing most importantly the difference between maintenance and prns and under what circumstances the prns are to be triggered using an action plan format where appropriate.  Total time for H and P, chart review, counseling, reviewing hfa/neb device(s) and generating customized AVS unique to this office visit / same day charting = 22 min

## 2022-08-03 NOTE — Patient Instructions (Signed)
No change in  respiratory medications   Please schedule a follow up visit in 12 months but call sooner if needed to the [place] office

## 2022-08-03 NOTE — Assessment & Plan Note (Signed)
CT chest 01/10/12 1. Bilateral bronchiectasis is most evident in the left lower lobe where ground-glass attenuation and scarring is evident. No significant airspace consolidation is present. 2. Upper lobe bronchiectasis is slightly worse on the right. No significant parenchymal disease is associated. 3. Single calcified right paratracheal node may represent treated disease or a history granulomatous disease. Alpha one AT >  MM  02/2012  PFT's wnl 11/01/2012  Prevar rx 06/25/13 and pneumovax 01/24/2014  CTa 11/21/13 > no acute changes  - PFT's  03/02/2016  FEV1 2.03 (104 % ) ratio 86  p 4 % improvement from saba p dulera 100 x2   prior to study with DLCO  88/89c % corrects to 98 % for alv volume    - 03/17/2021 requested  augmentin to have on hand for flares/ diflucan 100 mg x up to 3 days prn yeast infetions > refilled 08/03/2022   Well compensated on low dose symbiocort 80and prn augmentin/ diflucan as above

## 2022-08-28 ENCOUNTER — Encounter: Payer: Self-pay | Admitting: Internal Medicine

## 2022-08-30 NOTE — Telephone Encounter (Signed)
We have the 80 strength (green box) as the one she was on but if she was actually on the 160 (blue box) then ok to refill the same rx / same sig  - we always seek the lowest dose that works so if that was indeed the 160 that's fine.

## 2022-09-01 MED ORDER — BUDESONIDE-FORMOTEROL FUMARATE 160-4.5 MCG/ACT IN AERO: 2.0000 | INHALATION_SPRAY | Freq: Two times a day (BID) | RESPIRATORY_TRACT | 3 refills | Status: DC

## 2022-09-01 NOTE — Telephone Encounter (Signed)
Called [facility] and cancelled the Symbicort RX. I have sent in the Symbicort RX for patient and she was made aware via MyChart.

## 2023-01-14 ENCOUNTER — Telehealth: Payer: Self-pay | Admitting: Internal Medicine

## 2023-01-17 NOTE — Telephone Encounter (Signed)
Patient advised to contact PCP, HD, or [facility] for complete immz record. Nothing further needed at this time.

## 2023-02-03 ENCOUNTER — Telehealth: Payer: Self-pay | Admitting: Internal Medicine

## 2023-02-03 NOTE — Telephone Encounter (Signed)
Patient is calling because she wants to know her last pneumonia shot. She is seeing a new pcp and they would like to know. [phone]

## 2023-02-03 NOTE — Telephone Encounter (Signed)
Per chart: Pna 13 06/25/13 Pna 23 01/24/14  Spoke with patient and provided above information. Also advised PCP offices can access [facility] for more information. She will let them know.

## 2023-08-19 ENCOUNTER — Encounter: Payer: Self-pay | Admitting: Internal Medicine

## 2023-08-19 ENCOUNTER — Ambulatory Visit (INDEPENDENT_AMBULATORY_CARE_PROVIDER_SITE_OTHER): Admitting: Internal Medicine

## 2023-08-19 VITALS — BP 159/77 | HR 78 | Ht 60.0 in | Wt 156.0 lb

## 2023-08-19 DIAGNOSIS — J479 Bronchiectasis, uncomplicated: Secondary | ICD-10-CM | POA: Diagnosis not present

## 2023-08-19 DIAGNOSIS — Z87891 Personal history of nicotine dependence: Secondary | ICD-10-CM | POA: Diagnosis not present

## 2023-08-19 DIAGNOSIS — J4530 Mild persistent asthma, uncomplicated: Secondary | ICD-10-CM

## 2023-08-19 NOTE — Patient Instructions (Signed)
No change in recommendations  Please schedule a follow up visit in 12 months but call sooner if needed  

## 2023-08-19 NOTE — Assessment & Plan Note (Addendum)
 Onset in childhood, recurred spring 1980    - D/c advair  10/18/2011 due to cough   - HFA 10% baseline 02/18/2012 > changed to neb laba/ics and d/c singulair  since flared on it   - Allergy  Profile 12/07/11 >  IgE 89 but no specific allergen identified - hfa 75% p coaching 11/01/2012 and can't afford pulmocort neb > rechallenged with dulera 100 2bid  -Med calendar 11/20/2012    - FENO 12/02/2015  =   15 on dulera 100 2bid   - 08/31/2018  After extensive coaching inhaler device,  effectiveness =    90% so add symb 160 2bid for flares if ab to avoid steroids, o/w maint on advair  115 2bid for insurance >Wanted to change back to symbicort  160 with new insurance in 2022  I prefer the symb 80 in pts wit bronchiectasis but she's convinced after a month of symbiocrt 80 that she was better on the h160 and her infections have been easy to treat so ok to continue symb 160 and f/u in 12 m, sooner prn         Each maintenance medication was reviewed in detail including emphasizing most importantly the difference between maintenance and prns and under what circumstances the prns are to be triggered using an action plan format where appropriate.  Total time for H and P, chart review, counseling, reviewing hfa  device(s) and generating customized AVS unique to this office visit / same day charting = 21 min

## 2023-08-19 NOTE — Assessment & Plan Note (Signed)
  CT chest 01/10/12 1. Bilateral bronchiectasis is most evident in the left lower lobe where ground-glass attenuation and scarring is evident. No significant airspace consolidation is present. 2. Upper lobe bronchiectasis is slightly worse on the right. No significant parenchymal disease is associated. 3. Single calcified right paratracheal node may represent treated disease or a history granulomatous disease. Alpha one AT >  MM  02/2012  PFT's wnl 11/01/2012  Prevar rx 06/25/13 and pneumovax 01/24/2014  CTa 11/21/13 > no acute changes  - PFT's  03/02/2016  FEV1 2.03 (104 % ) ratio 86  p 4 % improvement from saba p dulera 100 x2   prior to study with DLCO  88/89c % corrects to 98 % for alv volume    - 03/17/2021 requested  augmentin  to have on hand for flares/ diflucan  100 mg x up to 3 days prn yeast infetions > refilled 08/03/2022   Adequate control on present rx, reviewed in detail with pt > no change in rx needed

## 2023-08-19 NOTE — Progress Notes (Signed)
 Subjective:   Patient ID: Caitlin Newman, female    DOB: 1945/08/12   MRN: [id number]   Brief patient profile:  77 yowf no significant  smoking hx  With lots of wheezing as child outgrew at very young age (doesn't remember exactly when) then recurrent wheezing coughing worse in spring since around 1980 prev seen by Caitlin Newman well controlled until Nov 2012 with refractory cough since then so referred 08/06/2011 to pulmonary clinic by PA Caitlin Newman from [place] with brochiectasis on CT 12/2011 and  nl pfts 11/01/2012    History of Present Illness  08/06/2011 1st pulmonary eval/ Caitlin Newman cc persistent cough x 6 months acute onset with "flu" in November now present daily  - had been on advair  maint and ACEI but Caitlin latter was stopped early May 2013 no improvement cough which is worse p eat and afternoons and cough for an hour at bedtime with sense of something stuck upper mid chest and coughs to point of choking/vomiting>  Productive of min yellow mucus. rec Stop advair , tessilon - only use albuterol  (ventolin ) puffer as rescue Start Pulmicort  twice daily with nebulized albuterol  until cough completely gone for a couple of weeks then restart Caitlin advair   Schedule sinus ct > Mild chronic sinusitis with mucosal edema in Caitlin maxillary sinus  bilaterally. Try prilosec 20mg   Take 30-60 min before first meal of Caitlin day and Pepcid  20 mg one bedtime until cough is completely gone for at least a week without Caitlin need for cough suppression GERD diet Prednisone  10 mg take  4 each am x 2 days,   2 each am x 2 days,  1 each am x2days and stop           09/03/2014  Acute ov/Caitlin Newman re: bronchiectasis  Chief Complaint  Patient presents with   Acute Visit    Pt c/o increased SOB, cough, chest tightness x 1 wk. She is coughing up very minimal clear sputum. She is using ventolin  3 x per day and neb x 1 per day.    holding out on using tramadol  until "coughs so hard she hurts " Has med calendar/ not following  action plans   >>augmentin  and pred   03/17/2021  f/u ov/Caitlin Newman re: bronchiectasis / now living in [place] on symbicort     Chief Complaint  Patient presents with   Follow-up    Breathing is overall doing well. She has not had to use albuterol  inhaler or neb in Caitlin past year.   Dyspnea:  not limited  Cough: better now  Sleeping: flat bed, two pillows  SABA use: none  02: none  Covid status:   vax x 3 / never infected  Rec For flares of nasty mucus Augmentin  875 mg take one pill twice daily  X 10 days  Diflucan  100 mg up to 3 days for yeast infections    12/22/2021  f/u ov/ office/Caitlin Newman re: bronchiectasis  maint on symbicort  160   Chief Complaint  Patient presents with   Follow-up   Dyspnea:  Not limited by breathing from desired activities  / walks dog a mile a day  Cough: none unless gets choked x years getting worse  Sleeping: flat bed / 2 pillows  SABA use: none  02: none  Rec Try symbicort  80 Take 2 puffs first thing in am and then another 2 puffs about 12 hours later.  Work on inhaler technique:  If doing well on 80 strength then Caitlin next refill for 3  month supply we can change 80 but ok to use them up  Please schedule a follow up visit in 12 months but call sooner if needed     08/03/2022  f/u ov/[place] office/Caitlin Newman re: bronchiectasis  maint on symbicort   80  / aumentin x 3 cycles this year  Chief Complaint  Patient presents with   Follow-up    Breathing is overall doing well. No new co's today.    Dyspnea:  walking dog a mile a day   Cough: ok x getting choked only pills  Sleeping: 2 pillows /flat bed  SABA use: none  02: none Rec No change in  respiratory medications  Please schedule a follow up visit in 12 months but call sooner if needed to [place] office    08/19/2023  f/u ov/[place] office/Caitlin Newman re: bronchiectasis  maint on symbicort  160  / last augmentin   x 3 m  prior to OV   Chief Complaint  Patient presents with   Follow-up   Dyspnea:  working around Caitlin yard / slowed by hips more than breathing  Cough: none  Sleeping: flat bed 2 pillows resp cc  SABA use: none  02: none     No obvious day to day or daytime variability or assoc excess/ purulent sputum or mucus plugs or hemoptysis or cp or chest tightness, subjective wheeze or overt sinus or hb symptoms.    Also denies any obvious fluctuation of symptoms with weather or environmental changes or other aggravating or alleviating factors except as outlined above   No unusual exposure hx or h/o childhood pna/ asthma or knowledge of premature birth.  Current Allergies, Complete Past Medical History, Past Surgical History, Family History, and Social History were reviewed in Caitlin Newman record.  ROS  Caitlin following are not active complaints unless bolded Hoarseness, sore throat, dysphagia, dental problems, itching, sneezing,  nasal congestion or discharge of excess mucus or purulent secretions, ear ache,   fever, chills, sweats, unintended wt loss or wt gain, classically pleuritic or exertional cp,  orthopnea pnd or arm/hand swelling  or leg swelling, presyncope, palpitations, abdominal pain, anorexia, nausea, vomiting, diarrhea  or change in bowel habits or change in bladder habits, change in stools or change in urine, dysuria, hematuria,  rash, arthralgias, visual complaints, headache, numbness, weakness or ataxia or problems with walking or coordination,  change in mood or  memory.        Current Meds  Medication Sig   albuterol  (PROVENTIL ) (2.5 MG/3ML) 0.083% nebulizer solution Take 3 mLs (2.5 mg total) by nebulization every 4 (four) hours as needed for wheezing or shortness of breath (((PLAN C))).   albuterol  (VENTOLIN  HFA) 108 (90 Base) MCG/ACT inhaler Inhale 2 puffs into Caitlin lungs every 6 (six) hours as needed for wheezing or shortness of breath.   amoxicillin -clavulanate (AUGMENTIN ) 875-125 MG tablet Take 1 tablet by mouth 2 (two) times  daily.   budesonide -formoterol  (SYMBICORT ) 160-4.5 MCG/ACT inhaler Inhale 2 puffs into Caitlin lungs 2 (two) times daily.   chlorpheniramine (CHLOR-TRIMETON) 4 MG tablet Take 4 mg by mouth every 4 (four) hours as needed (drippy nose, throat clearing, post nasal drip).    Coenzyme Q10 (CO Q 10 PO) Take 1 tablet daily by mouth.   ergocalciferol (VITAMIN D2) 1.25 MG (50000 UT) capsule Take 50,000 Units by mouth once a week.   famotidine  (PEPCID ) 20 MG tablet Take 20 mg by mouth at bedtime.   fluconazole  (DIFLUCAN ) 100 MG tablet Take 1 tablet (100  mg total) by mouth daily.   losartan (COZAAR) 25 MG tablet Take 25 mg by mouth in Caitlin morning and at bedtime.   omeprazole  (PRILOSEC) 40 MG capsule Take 40 mg by mouth daily.   Omeprazole  20 MG TBEC Take 30- 60 min before your first meal of Caitlin day   Respiratory Therapy Supplies (FLUTTER) DEVI Use as directed.   rosuvastatin (CRESTOR) 5 MG tablet Take 5 mg by mouth daily.   sertraline (ZOLOFT) 100 MG tablet Take 1 tablet by mouth daily.                           Objective:   Physical Exam  wts  08/19/2023      156   08/03/2022    150 12/22/2021  155 03/17/2021      146   02/06/2020  150  10/25/2019   150  10/16/2019     149 10/20/2017     156   Wt 156 08/06/2011  > 156 12/07/2011 > 12/27/2011  155>155 01/10/12 > 02/18/2012  158 > 157 03/24/2012  > 11/01/2012   149 > 150 11/20/2012 > 154  04/25/2013 >152 05/14/2013 > 06/25/2013  148 > 10/16/2013  147 > 10/23/2013 149>150 11/13/2013 > 12/13/2013  147 > 01/24/2014 150 >153 04/25/2014 >  09/03/2014 >144 12/04/2014 > 02/24/2015 143 > 08/31/2016  151  > 01/19/2017  132      Vital signs reviewed  08/19/2023  - Note at rest 02 sats  96% on RA   General appearance:    amb wf nad    HEENT : Oropharynx  clear      Nasal turbinates nl    NECK :  without  apparent JVD/ palpable Nodes/TM    LUNGS: no acc muscle use,  Nl contour chest which is clear to A and P bilaterally without cough on insp or exp maneuvers   CV:  RRR  no  s3 or murmur or increase in P2, and no edema   ABD:  soft and nontender   MS:    ext warm without deformities Or obvious joint restrictions  calf tenderness, cyanosis or clubbing    SKIN: warm and dry without lesions    NEURO:  alert, approp, nl sensorium with  no motor or cerebellar deficits apparent.      Assessment & Plan:

## 2023-09-17 ENCOUNTER — Other Ambulatory Visit: Payer: Self-pay | Admitting: Internal Medicine

## 2023-09-24 ENCOUNTER — Other Ambulatory Visit: Payer: Self-pay | Admitting: Internal Medicine

## 2023-09-28 ENCOUNTER — Ambulatory Visit: Payer: Self-pay

## 2023-09-28 NOTE — Telephone Encounter (Signed)
 FYI Only or Action Required?: Action required by provider: update on patient condition.  Dr. Everardo Pulmonolgy appt. last seen on 08/19/23  Called Nurse Triage reporting Shortness of Breath/Cough.  Symptoms began several weeks ago.  Interventions attempted: Prescription medications: Prednisone /AmoxiClav.  Symptoms are: unchanged.  Triage Disposition: Call PCP Now, See PCP Within 2 Weeks  Patient/caregiver understands and will follow disposition?: No, wishes to speak with PCP  **Patient is Out of [place], but wishes to report symptoms to Caitlin Newman**                     Copied from CRM [id number]. Topic: Clinical - Red Word Triage >> Sep 28, 2023 10:55 AM Caitlin Newman wrote: [facility] that prompted transfer to Nurse Triage: Chest Tightness Reason for Disposition  [1] MILD longstanding difficulty breathing (e.g., minimal/no SOB at rest, SOB with walking, pulse < 100) AND [2] SAME as normal  [1] Caller requests to speak ONLY to PCP AND [2] URGENT question  Answer Assessment - Initial Assessment Questions 1. RESPIRATORY STATUS: Describe your breathing? (e.g., wheezing, shortness of breath, unable to speak, severe coughing)      Cough, chest tightness  2. ONSET: When did this breathing problem begin?      July 6th  3. PATTERN Does the difficult breathing come and go, or has it been constant since it started?      No difficulty breathing.   4. SEVERITY: How bad is your breathing? (e.g., mild, moderate, severe)      Fine, only when coughing  5. RECURRENT SYMPTOM: Have you had difficulty breathing before? If Yes, ask: When was the last time? and What happened that time?      Yes  6. CARDIAC HISTORY: Do you have any history of heart disease? (e.g., heart attack, angina, bypass surgery, angioplasty)        7. LUNG HISTORY: Do you have any history of lung disease?  (e.g., pulmonary embolus, asthma, emphysema)       8. CAUSE: What do you think is causing the  breathing problem?      Recently ill   9. OTHER SYMPTOMS: Do you have any other symptoms? (e.g., chest pain, cough, dizziness, fever, runny nose)     No    On 7/6 she was started on Amoxiclav  and Prednisone  pak, she is having cough. She is out of nebulizer medication. She does not have a rescue inhaler. She reports not being able to cough up any mucus, but feels it in her chest. Patient stated she'd rather a message be sent to Caitlin Newman, she declined an appointment as she is out of town.  Protocols used: Breathing Difficulty-A-AH, PCP Call - No Triage-A-AH

## 2023-09-28 NOTE — Telephone Encounter (Signed)
 Darlean Ozell NOVAK, MD to Me      09/28/23 12:14 PM If not better p prednsione no reason to repeat and will need ov asap - if better p pred just repeat same rx but set up f/u as well but not asap   Spoke with the pt and notified of response from Dr. Darlean  She verbalized understanding  She has another dose of pred to take and feels she has had no improvement  I scheduled her acute ov with AO at 11 am tomorrow

## 2023-09-29 ENCOUNTER — Ambulatory Visit (INDEPENDENT_AMBULATORY_CARE_PROVIDER_SITE_OTHER): Admitting: Pulmonary Disease

## 2023-09-29 VITALS — BP 133/68 | HR 78 | Ht 60.0 in | Wt 155.4 lb

## 2023-09-29 DIAGNOSIS — J479 Bronchiectasis, uncomplicated: Secondary | ICD-10-CM | POA: Diagnosis not present

## 2023-09-29 DIAGNOSIS — J4530 Mild persistent asthma, uncomplicated: Secondary | ICD-10-CM

## 2023-09-29 DIAGNOSIS — Z87891 Personal history of nicotine dependence: Secondary | ICD-10-CM

## 2023-09-29 MED ORDER — ALBUTEROL SULFATE (2.5 MG/3ML) 0.083% IN NEBU: 2.5000 mg | INHALATION_SOLUTION | Freq: Four times a day (QID) | RESPIRATORY_TRACT | 6 refills | Status: AC | PRN

## 2023-09-29 MED ORDER — HYDROCODONE BIT-HOMATROP MBR 5-1.5 MG/5ML PO SOLN: 5.0000 mL | Freq: Four times a day (QID) | ORAL | 0 refills | Status: AC | PRN

## 2023-09-29 NOTE — Progress Notes (Signed)
 Caitlin Newman    [id number]    [birth year]  Primary Care Physician:Newman, Garnette, MD  Referring Physician: Pecolia Garnette, MD [id number] [place],  [place] [address]  Chief complaint:   Patient with cough, congestion, background history of bronchiectasis  HPI:  Completing course of Augmentin  and steroids Usually uses Symbicort   She did try to resume using a nebulizer but noted that the medications have expired   Prior exacerbation was about 6 months ago She did have multiple exacerbations the year before  Follows up with Dr. Darlean on a regular basis  Did call for an urgent visit as she was not feeling very well yesterday but today actually feeling much better  Denies any chest pains or chest discomfort Denies weight loss Appetite is maintained  She does have a cough that is productive, overall feeling better  Outpatient Encounter Medications as of 09/29/2023  Medication Sig   albuterol  (PROVENTIL ) (2.5 MG/3ML) 0.083% nebulizer solution Take 3 mLs (2.5 mg total) by nebulization every 4 (four) hours as needed for wheezing or shortness of breath (((PLAN C))).   albuterol  (VENTOLIN  HFA) 108 (90 Base) MCG/ACT inhaler Inhale 2 puffs into the lungs every 6 (six) hours as needed for wheezing or shortness of breath.   amoxicillin -clavulanate (AUGMENTIN ) 875-125 MG tablet TAKE 1 TABLET BY MOUTH TWICE DAILY   chlorpheniramine (CHLOR-TRIMETON) 4 MG tablet Take 4 mg by mouth every 4 (four) hours as needed (drippy nose, throat clearing, post nasal drip).    Coenzyme Q10 (CO Q 10 PO) Take 1 tablet daily by mouth.   ergocalciferol (VITAMIN D2) 1.25 MG (50000 UT) capsule Take 50,000 Units by mouth once a week.   famotidine  (PEPCID ) 20 MG tablet Take 20 mg by mouth at bedtime.   fluconazole  (DIFLUCAN ) 100 MG tablet Take 1 tablet (100 mg total) by mouth daily.   losartan (COZAAR) 25 MG tablet Take 25 mg by mouth in the morning and at bedtime.   omeprazole  (PRILOSEC) 40 MG  capsule Take 40 mg by mouth daily.   Omeprazole  20 MG TBEC Take 30- 60 min before your first meal of the day   Respiratory Therapy Supplies (FLUTTER) DEVI Use as directed.   rosuvastatin (CRESTOR) 5 MG tablet Take 5 mg by mouth daily.   sertraline (ZOLOFT) 100 MG tablet Take 1 tablet by mouth daily.   SYMBICORT  160-4.5 MCG/ACT inhaler USE 2 INHALATIONS BY MOUTH TWICE DAILY   No facility-administered encounter medications on file as of 09/29/2023.    Allergies as of 09/29/2023 - Review Complete 09/29/2023  Allergen Reaction Noted   Avelox [moxifloxacin hcl in nacl]  08/05/2011   Sulfa antibiotics  08/05/2011    Past Medical History:  Diagnosis Date   Alkaline phosphatase elevation    Allergic rhinitis    Asthma    Depression    Esophageal candidiasis (HCC)    Headache(784.0)    Hyperlipidemia    Hypertension     Past Surgical History:  Procedure Laterality Date   CHOLECYSTECTOMY     RECTOCELE REPAIR      Family History  Problem Relation Age of Onset   Cervical cancer Mother    Lung cancer Father    Breast cancer Maternal Aunt    Lung cancer Other        uncle- ? 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[employer]  Tobacco Use   Smoking status: Former    Current packs/day: 0.50    Average packs/day: 0.5 packs/day for 20.0 years (10.0 ttl pk-yrs)    Types: Cigarettes   Smokeless tobacco: Never  Substance and Sexual Activity   Alcohol use: No    Alcohol/week: 0.0 standard drinks of alcohol   Drug use: No   Sexual activity: Not on file  Other Topics Concern   Not on file  Social History Narrative   Not on file   Social Drivers of Health   Financial Resource Strain: Not on file  Food Insecurity: Not on file  Transportation Needs:  Not on file  Physical Activity: Not on file  Stress: Not on file  Social Connections: Unknown (07/25/2021)   Received from [facility]   Social Network    Social Network: Not on file  Intimate Partner Violence: Unknown (06/18/2021)   Received from [facility]   HITS    Physically Hurt: Not on file    Insult or Talk Down To: Not on file    Threaten Physical Harm: Not on file    Scream or Curse: Not on file    Review of Systems  Respiratory:  Positive for cough and shortness of breath.     Vitals:   09/29/23 1041 09/29/23 1042  BP: 133/68 133/68  Pulse: 78   SpO2: 98%      Physical Exam Constitutional:      Appearance: Normal appearance.  HENT:     Head: Normocephalic.     Mouth/Throat:     Mouth: Mucous membranes are moist.  Eyes:     General: No scleral icterus. Cardiovascular:     Rate and Rhythm: Normal rate and regular rhythm.     Heart sounds: No murmur heard.    No friction rub.  Pulmonary:     Effort: No respiratory distress.     Breath sounds: No stridor. No wheezing or rhonchi.  Musculoskeletal:     Cervical back: No rigidity or tenderness.  Neurological:     Mental Status: She is alert.  Psychiatric:        Mood and Affect: Mood normal.    Data Reviewed: No recent PFT  Last CT scan of the chest was 11/21/2013  No abnormal microbiology noted in records  Most recent visit with Dr. Neomi reviewed 08/19/2023  Assessment:  Bronchiectasis with exacerbation - Completing course of antibiotics and steroids - Symptoms slightly improved  Chronic cough  Mild persistent asthma  History of GERD  Plan/Recommendations: Will send in a prescription for albuterol  to be used via nebulization  Complete course of steroids  Prescription for Hydromet for cough management - May consider over-the-counter Delsym for cough management  Follow-up as previously scheduled with Dr. Darlean [name] MD [facility] 09/29/2023, 10:48  AM  CC: Caitlin Senior, MD

## 2023-09-29 NOTE — Patient Instructions (Signed)
 Prescription for albuterol  nebulization will be sent to pharmacy for you  I did send in a prescription for cough medicine called Hydromet-it is supposed to help calm down the cough so that you can at least get better sleep at night  You can also consider Delsym which is over-the-counter for the same effect on the cough  Call us  with significant concerns  Keep previous follow-up appointment with Dr. Darlean as scheduled

## 2023-12-02 NOTE — Progress Notes (Signed)
 This encounter was created in error - please disregard.
